# Patient Record
Sex: Male | Born: 1985 | Race: White | Hispanic: No | Marital: Single | Smoking: Never smoker
Health system: Southern US, Community
[De-identification: ages and names within clinical notes are randomized; demographics above are authoritative.]

## PROBLEM LIST (undated history)

## (undated) DIAGNOSIS — D649 Anemia, unspecified: Secondary | ICD-10-CM

## (undated) DIAGNOSIS — C801 Malignant (primary) neoplasm, unspecified: Secondary | ICD-10-CM

## (undated) DIAGNOSIS — T7840XA Allergy, unspecified, initial encounter: Secondary | ICD-10-CM

## (undated) HISTORY — DX: Malignant (primary) neoplasm, unspecified: C80.1

## (undated) HISTORY — DX: Anemia, unspecified: D64.9

## (undated) HISTORY — PX: OTHER SURGICAL HISTORY: SHX169

## (undated) HISTORY — DX: Allergy, unspecified, initial encounter: T78.40XA

---

## 2003-11-01 ENCOUNTER — Encounter: Admission: RE | Admit: 2003-11-01 | Discharge: 2003-11-01 | Payer: Self-pay

## 2003-12-13 ENCOUNTER — Encounter: Admission: RE | Admit: 2003-12-13 | Discharge: 2003-12-13 | Payer: Self-pay

## 2005-10-15 ENCOUNTER — Encounter: Admission: RE | Admit: 2005-10-15 | Discharge: 2005-10-15 | Payer: Self-pay | Admitting: Orthopedic Surgery

## 2007-06-14 ENCOUNTER — Encounter: Admission: RE | Admit: 2007-06-14 | Discharge: 2007-06-14 | Payer: Self-pay | Admitting: Orthopedic Surgery

## 2009-01-03 ENCOUNTER — Encounter: Admission: RE | Admit: 2009-01-03 | Discharge: 2009-01-03 | Payer: Self-pay | Admitting: Family Medicine

## 2012-09-12 ENCOUNTER — Ambulatory Visit (INDEPENDENT_AMBULATORY_CARE_PROVIDER_SITE_OTHER): Payer: BC Managed Care – PPO | Admitting: Family Medicine

## 2012-09-12 VITALS — BP 118/75 | HR 92 | Temp 98.6°F | Resp 17 | Ht 72.0 in | Wt 205.0 lb

## 2012-09-12 DIAGNOSIS — Z23 Encounter for immunization: Secondary | ICD-10-CM

## 2012-09-12 DIAGNOSIS — R300 Dysuria: Secondary | ICD-10-CM

## 2012-09-12 DIAGNOSIS — R309 Painful micturition, unspecified: Secondary | ICD-10-CM

## 2012-09-12 DIAGNOSIS — Z7189 Other specified counseling: Secondary | ICD-10-CM

## 2012-09-12 DIAGNOSIS — R369 Urethral discharge, unspecified: Secondary | ICD-10-CM

## 2012-09-12 DIAGNOSIS — Z113 Encounter for screening for infections with a predominantly sexual mode of transmission: Secondary | ICD-10-CM

## 2012-09-12 DIAGNOSIS — Z789 Other specified health status: Secondary | ICD-10-CM

## 2012-09-12 LAB — POCT UA - MICROSCOPIC ONLY
Bacteria, U Microscopic: NEGATIVE
Casts, Ur, LPF, POC: NEGATIVE
Crystals, Ur, HPF, POC: NEGATIVE
Epithelial cells, urine per micros: NEGATIVE
Mucus, UA: NEGATIVE
Yeast, UA: NEGATIVE

## 2012-09-12 LAB — POCT URINALYSIS DIPSTICK
Blood, UA: NEGATIVE
Glucose, UA: NEGATIVE
Nitrite, UA: NEGATIVE
Spec Grav, UA: 1.015
Urobilinogen, UA: 0.2
pH, UA: 6.5

## 2012-09-12 MED ORDER — CEFTRIAXONE SODIUM 1 G IJ SOLR
250.0000 mg | INTRAMUSCULAR | Status: DC
  Administered 2012-09-12: 250 mg via INTRAMUSCULAR

## 2012-09-12 MED ORDER — AZITHROMYCIN 250 MG PO TABS: ORAL_TABLET | ORAL | Status: DC

## 2012-09-12 NOTE — Progress Notes (Signed)
[facility] [address], [place] [address] [phone]  Date:  09/12/2012   Name:  Matthew Santiago   DOB:  04-18-1986   MRN:  [id number]  PCP:  No primary provider on file.    Chief Complaint: Other and penis discharge   History of Present Illness:  Matthew Santiago is a 26 y.o. very pleasant male patient who presents with the following:  He noted some pain with urination and penile discharge- discharge appears clear- since last night.  Otherwise he feels ok except he is tired.  No back pain or fever.    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  No further treatment needed- he is now 10 years cancer- free and considered cured.   He did have a new sexual partner not long ago.  He is concerned about STIs and would like screning  No abdominal pain, no nausea or vomiting  It appears that there was some confusion in 2011- he requested STI screening and started Gardasil at a visit in November.  His Hep B titer showed that he was immune through vaccination.  However, when Bas[place] heard these results he was told that he "did not need the rest of the shots" and thought he was immune to HPV.  Therefore, he never completed the series.   There is no problem list on file for this patient.   Past Medical History  Diagnosis Date  . Cancer     History reviewed. No pertinent past surgical history.  History  Substance Use Topics  . Smoking status: Never Smoker   . Smokeless tobacco: Not on file  . Alcohol Use: 1.2 oz/week    2 Cans of beer per week    History reviewed. No pertinent family history.  No Known Allergies  Medication list has been reviewed and updated.  No current outpatient prescriptions on file prior to visit.    Review of Systems:  As per HPI- otherwise negative.   Physical Examination: Filed Vitals:   09/12/12 1400  BP: 118/75  Pulse: 92  Temp: 98.6 F (37 C)  Resp: 17   Filed Vitals:    09/12/12 1400  Height: 6' (1.829 m)  Weight: 205 lb (92.987 kg)   Body mass index is 27.80 kg/(m^2). Ideal Body Weight: Weight in (lb) to have BMI = 25: 183.9   GEN: WDWN, NAD, Non-toxic, A & O x 3 HEENT: Atraumatic, Normocephalic. Neck supple. No masses, No LAD. Ears and Nose: No external deformity. CV: RRR, No M/G/R. No JVD. No thrill. No extra heart sounds. PULM: CTA B, no wheezes, crackles, rhonchi. No retractions. No resp. distress. No accessory muscle use. ABD: S, NT, ND EXTR: No c/c/e NEURO Normal gait.  PSYCH: Normally interactive. Conversant. Not depressed or anxious appearing.  Calm demeanor.  Gu; no scrotal masses, no apparent penile discharge  Results for orders placed in visit on 09/12/12  POCT UA - MICROSCOPIC ONLY      Component Value Range   WBC, Ur, HPF, POC neg     RBC, urine, microscopic neg\     Bacteria, U Microscopic neg     Mucus, UA neg     Epithelial cells, urine per micros neg     Crystals, Ur, HPF, POC neg     Casts, Ur, LPF, POC neg     Yeast, UA neg    POCT URINALYSIS DIPSTICK      Component  Value Range   Color, UA yellow     Clarity, UA clear     Glucose, UA neg     Bilirubin, UA neg     Ketones, UA neg     Spec Grav, UA 1.015     Blood, UA neg     pH, UA 6.5     Protein, UA neg     Urobilinogen, UA 0.2     Nitrite, UA neg     Leukocytes, UA Negative      Assessment and Plan: 1. Urination pain  POCT UA - Microscopic Only, POCT urinalysis dipstick, Urine culture  2. Penile discharge  GC/chlamydia probe amp, urine, cefTRIAXone (ROCEPHIN) injection 250 mg, azithromycin (ZITHROMAX) 250 MG tablet  3. Routine screening for STI (sexually transmitted infection)  HIV antibody, Hepatitis C antibody, RPR, HSV(herpes simplex vrs) 1+2 ab-IgG   Possible STI exposure.  Kassidy is going out of town for Christmas and is concerned- he would like to go ahead and be treated today in case he does have gonorrhea or chlamydia.  Gave shot of rocephin and rx  for 1gm of azithromycin to hold.  Also did BW as above.  Explained that he does not need Hep B testing any more as he is immune.    There was a mix- up in 2011- he received his 1st gardasil shot.  He was tested for Hep B and was immune- he was then told that he did not need to continue shots.  He did not realize we were talking about Hep B and he was talking about Gardasil.  This appears to be our mistake.  Apologized for this confusion.  He would like to go ahead and do Gardasil #2 today.  He called his insurance company and they will still cover the shots.  He is aware that this is off- label use as he is over the age of 72, but he feels comfortable going ahead.  He will RTC in 4 months for his last gardasil.   Meds ordered this encounter  Medications  . cefTRIAXone (ROCEPHIN) injection 250 mg    Sig:   . azithromycin (ZITHROMAX) 250 MG tablet    Sig: Take 4 tablets once    Dispense:  4 tablet    Refill:  0     Authur Cubit, MD

## 2012-09-12 NOTE — Patient Instructions (Addendum)
I will be in touch with you regarding your labs- if need be you can fill and take the azithromycin.   I am sorry for the mix- up regarding your Gardasil shots.  Your next Gardasil will be due in 4 months.  You are immune to hepatitis B through vaccination and do not have to be tested for this again

## 2012-09-13 LAB — HEPATITIS C ANTIBODY: HCV Ab: NEGATIVE

## 2012-09-13 LAB — HIV ANTIBODY (ROUTINE TESTING W REFLEX): HIV: NONREACTIVE

## 2012-09-13 LAB — HSV(HERPES SIMPLEX VRS) I + II AB-IGG: HSV 2 Glycoprotein G Ab, IgG: 0.17 IV

## 2012-09-13 LAB — RPR

## 2012-09-13 LAB — GC/CHLAMYDIA PROBE AMP, URINE: GC Probe Amp, Urine: NEGATIVE

## 2012-09-14 ENCOUNTER — Encounter: Payer: Self-pay | Admitting: Family Medicine

## 2013-06-01 ENCOUNTER — Ambulatory Visit (INDEPENDENT_AMBULATORY_CARE_PROVIDER_SITE_OTHER): Payer: BC Managed Care – PPO | Admitting: Family Medicine

## 2013-06-01 VITALS — BP 102/60 | HR 96 | Temp 98.6°F | Resp 19 | Wt 192.0 lb

## 2013-06-01 DIAGNOSIS — J029 Acute pharyngitis, unspecified: Secondary | ICD-10-CM

## 2013-06-01 DIAGNOSIS — R509 Fever, unspecified: Secondary | ICD-10-CM

## 2013-06-01 DIAGNOSIS — R52 Pain, unspecified: Secondary | ICD-10-CM

## 2013-06-01 LAB — POCT RAPID STREP A (OFFICE): Rapid Strep A Screen: NEGATIVE

## 2013-06-01 LAB — POCT CBC
Hemoglobin: 15.6 g/dL (ref 14.1–18.1)
Lymph, poc: 0.9 (ref 0.6–3.4)
MCH, POC: 32 pg — AB (ref 27–31.2)
MCHC: 32.6 g/dL (ref 31.8–35.4)
MID (cbc): 0.5 (ref 0–0.9)
MPV: 8 fL (ref 0–99.8)
POC Granulocyte: 5.5 (ref 2–6.9)
POC MID %: 7.4 %M (ref 0–12)
Platelet Count, POC: 160 10*3/uL (ref 142–424)
RBC: 4.88 M/uL (ref 4.69–6.13)
WBC: 6.9 10*3/uL (ref 4.6–10.2)

## 2013-06-01 MED ORDER — DOXYCYCLINE HYCLATE 100 MG PO TABS: 100.0000 mg | ORAL_TABLET | Freq: Two times a day (BID) | ORAL | Status: DC

## 2013-06-01 NOTE — Progress Notes (Addendum)
[facility] [address], [place] [address] [phone]  Date:  06/01/2013   Name:  Matthew Santiago   DOB:  1986/07/08   MRN:  [id number]  PCP:  No primary provider on file.    Chief Complaint: fatigue, soreness, coughing, congestion, headaches, dry mout   History of Present Illness:  ADIS STURGILL is a 27 y.o. very pleasant male patient who presents with the following:  He is here today with illness. He noted that he felt warm on Wednesday night.  he has noted fatigue, ST, cough, aches, chills.  He has felt thirsty.    He has general aches, but no pains in particular.   No urinary sx.   No nausea, vomiting or diarrhea.  He has been eating.    He is generally healthy, no sick contacts.   He has been taking naproxen- he took a cold and flu medication last night.   This am he took dayquil.  He took this around 0830 this am.   He does spend a lot of time outdoors  History of bone cancer and reconstructive chest wallsurgery at age 73-now in remission.    Patient Active Problem List   Diagnosis Date Noted  . Immune to hepatitis B 09/12/2012    Past Medical History  Diagnosis Date  . Cancer     History reviewed. No pertinent past surgical history.  History  Substance Use Topics  . Smoking status: Never Smoker   . Smokeless tobacco: Not on file  . Alcohol Use: 1.2 oz/week    2 Cans of beer per week    Family History  Problem Relation Age of Onset  . Cancer Maternal Grandmother   . Heart disease Maternal Grandfather   . Cancer Paternal Grandmother     No Known Allergies  Medication list has been reviewed and updated.  Current Outpatient Prescriptions on File Prior to Visit  Medication Sig Dispense Refill  . azithromycin (ZITHROMAX) 250 MG tablet Take 4 tablets once  4 tablet  0   No current facility-administered medications on file prior to visit.    Review of Systems:  As per HPI- otherwise negative.   Physical  Examination: Filed Vitals:   06/01/13 1253  BP: 98/62  Pulse: 106  Temp: 102.3 F (39.1 C)  Resp: 19   Filed Vitals:   06/01/13 1253  Weight: 192 lb (87.091 kg)   Body mass index is 26.03 kg/(m^2). Ideal Body Weight:    GEN: WDWN, NAD, Non-toxic, A & O x 3 HEENT: Atraumatic, Normocephalic. Neck supple. No masses, No LAD.  Bilateral TM wnl, oropharynx injected with exudate bilaterally, no sign of abscess.  PEERL,EOMI.   Ears and Nose: No external deformity. CV: RRR, No M/G/R. No JVD. No thrill. No extra heart sounds. PULM: CTA B, no wheezes, crackles, rhonchi. No retractions. No resp. distress. No accessory muscle use. ABD: S, NT, ND, +BS. No rebound. No HSM. EXTR: No c/c/e, no rash NEURO Normal gait.  PSYCH: Normally interactive. Conversant. Not depressed or anxious appearing.  Calm demeanor.    Results for orders placed in visit on 06/01/13  POCT RAPID STREP A (OFFICE)      Result Value Range   Rapid Strep A Screen Negative  Negative  POCT INFLUENZA A/B      Result Value Range   Influenza A, POC Negative     Influenza B, POC Negative    POCT CBC      Result  Value Range   WBC 6.9  4.6 - 10.2 K/uL   Lymph, poc 0.9  0.6 - 3.4   POC LYMPH PERCENT 13.4  10 - 50 %L   MID (cbc) 0.5  0 - 0.9   POC MID % 7.4  0 - 12 %M   POC Granulocyte 5.5  2 - 6.9   Granulocyte percent 79.2  37 - 80 %G   RBC 4.88  4.69 - 6.13 M/uL   Hemoglobin 15.6  14.1 - 18.1 g/dL   HCT, POC 74.2  59.5 - 53.7 %   MCV 97.9 (*) 80 - 97 fL   MCH, POC 32.0 (*) 27 - 31.2 pg   MCHC 32.6  31.8 - 35.4 g/dL   RDW, POC 63.8     Platelet Count, POC 160  142 - 424 K/uL   MPV 8.0  0 - 99.8 fL   Placed an 18 gauge IV in right AC per Elease Etienne, LPN Given 756 mg of ibuprofen po- fever resolved as per VS.   Given a liter of IV saline; felt better, "I feel hungry"   Assessment and Plan: Sore throat - Plan: POCT rapid strep A, POCT CBC, Culture, Group A Strep  Body aches - Plan: POCT Influenza A/B  Fever,  unspecified - Plan: POCT CBC, Culture, Group A Strep, doxycycline (VIBRA-TABS) 100 MG tablet, HIV Antibody  Fever, sore throat, body aches.   Suspect a viral infection but will cover for RMSF given high fever without definite diagnosis.  He will rest and push fluids.   If getting worse or not better please call or RTC  Signed Abbe Amsterdam, MD  Called to check on him.  He is feeling better, temperature this am was 98.  He still is tired and having some sweats, but overall he is improved.  Let him know HIV is negative.

## 2013-06-01 NOTE — Patient Instructions (Addendum)
Use the doxycycline as directed.  Rest, and drink plenty of fluids.  Eat a bland diet as tolerated.  If you are not feeling better over the next couple of days please let me know.  If you start to get worse please let us know right away.    Alternate tylenol and ibuprofen as needed for fever control.

## 2013-06-02 LAB — HIV ANTIBODY (ROUTINE TESTING W REFLEX): HIV: NONREACTIVE

## 2013-06-03 ENCOUNTER — Encounter: Payer: Self-pay | Admitting: Family Medicine

## 2013-06-03 LAB — CULTURE, GROUP A STREP: Organism ID, Bacteria: NORMAL

## 2013-08-30 ENCOUNTER — Ambulatory Visit (INDEPENDENT_AMBULATORY_CARE_PROVIDER_SITE_OTHER): Payer: BC Managed Care – PPO | Admitting: Family Medicine

## 2013-08-30 ENCOUNTER — Telehealth: Payer: Self-pay

## 2013-08-30 VITALS — BP 122/72 | HR 74 | Temp 98.0°F | Resp 16 | Ht 72.0 in | Wt 190.0 lb

## 2013-08-30 DIAGNOSIS — F988 Other specified behavioral and emotional disorders with onset usually occurring in childhood and adolescence: Secondary | ICD-10-CM

## 2013-08-30 MED ORDER — METHYLPHENIDATE HCL ER (OSM) 18 MG PO TBCR: 18.0000 mg | EXTENDED_RELEASE_TABLET | Freq: Every day | ORAL | Status: AC

## 2013-08-30 NOTE — Progress Notes (Signed)
Patient ID: Matthew Santiago MRN: [id number], DOB: 1986/07/25, 27 y.o. Date of Encounter: 08/30/2013, 4:00 PM  Primary Physician: No primary provider on file.  Chief Complaint: ADD/ADHD evaluation  HPI: 27 y.o. male with history below presents for ADD/ADHD evaluation. Patient would like treatment for his ADHD while taking his Cysco course/test this month and January that costs $3,500. Patient was previously on Concerta for a brief time in 2008 during finals and did well with this. He would like to go back on something similar to this. He does not want to be on anything long term. He just does not want to have to retake this course/test. He states that he has a difficult time staying on task once he sits down to start a task. He will eventually get the job done, but it may take a while and he worries about that with this upcoming course. When he was on Concerta previously he tolerated it well. He states that he was "muted" however. No chest pain or palpitations. No difficulty sleeping.   Of note, the patient had a benign chest wall tumor excised in 2003 and developed some palpitations for a brief time afterwards. He has not had any palpitations since 2005 or 2006. He has been cleared by cardiology for full activity.     Past Medical History  Diagnosis Date  . Cancer      Home Meds: Prior to Admission medications   Not on File    Allergies: No Known Allergies  History   Social History  . Marital Status: Single    Spouse Name: N/A    Number of Children: N/A  . Years of Education: N/A   Occupational History  . Not on file.   Social History Main Topics  . Smoking status: Never Smoker   . Smokeless tobacco: Not on file  . Alcohol Use: 1.2 oz/week    2 Cans of beer per week  . Drug Use: No  . Sexual Activity: Yes    Birth Control/ Protection: None   Other Topics Concern  . Not on file   Social History Narrative  . No narrative on file     Review of  Systems: Constitutional: negative for chills, fever, or fatigue  HEENT: negative for vision changes or hearing loss Cardiovascular: negative for chest pain or palpitations Respiratory: negative for hemoptysis, wheezing, shortness of breath, or cough Abdominal: negative for abdominal pain, nausea, vomiting, or diarrhea Dermatological: negative for rash Neurologic: negative for headache, dizziness, or syncope   Physical Exam: Blood pressure 122/72, pulse 74, temperature 98 F (36.7 C), temperature source Oral, resp. rate 16, height 6' (1.829 m), weight 190 lb (86.183 kg), SpO2 100.00%., Body mass index is 25.76 kg/(m^2). General: Well developed, well nourished, in no acute distress. Head: Normocephalic, atraumatic, eyes without discharge, sclera non-icteric, nares are without discharge. Bilateral auditory canals clear, TM's are without perforation, pearly grey and translucent with reflective cone of light bilaterally. Oral cavity moist, posterior pharynx without exudate, erythema, peritonsillar abscess, or post nasal drip.  Neck: Supple. No thyromegaly. Full ROM. No lymphadenopathy. Lungs: Clear bilaterally to auscultation without wheezes, rales, or rhonchi. Breathing is unlabored. Heart: RRR with S1 S2. No murmurs, rubs, or gallops appreciated. Msk:  Strength and tone normal for age. Extremities/Skin: Warm and dry. No clubbing or cyanosis. No edema. No rashes or suspicious lesions. Neuro: Alert and oriented X 3. Moves all extremities spontaneously. Gait is normal. CNII-XII grossly in tact. Psych:  Responds to questions appropriately with  a normal affect.   EKG: Horizontal axis question lead placement, T wave inversion in III and AVF, no prior to compare to   ASSESSMENT AND PLAN:  27 y.o. male here for ADD/ADHD evaluation -Records reviewed from ADD/ADHD evaluation 06/04/2001, documents ADHD, see scanned in copy -Patient would only like to take medication for the one month while he is  preparing for and taking his Cysco course/test that costs $3,500 -Database reviewed -Concerta 18 mg 1 po daily #30 no RF -Patient to get prior EKG and contact us. We will set up referral at that time. He agrees to do this.   Signed, Eula Listen, PA-C [facility] [place], [place] [address] [phone] 08/30/2013 4:00 PM

## 2013-08-30 NOTE — Telephone Encounter (Signed)
Pt was seen today by ryan dunn, pt states medication prescribed will need prior authorization

## 2013-08-30 NOTE — Telephone Encounter (Signed)
Pt was seen by ryan dunn today and states the medication prescribed requires prior authorization

## 2013-08-31 NOTE — Telephone Encounter (Signed)
Prior auth obtained: Matthew Santiago Jul 01, 2013 to Jul 01, 2014

## 2013-09-01 NOTE — Progress Notes (Signed)
EKG read and patient discussed with Ryan Dunn, PA-C. Agree with assessment and plan of care per his note.   

## 2013-09-03 ENCOUNTER — Other Ambulatory Visit: Payer: Self-pay | Admitting: Radiology

## 2013-09-05 NOTE — Progress Notes (Signed)
PA approved for concerta through 08/31/14. Faxed pharm.

## 2014-11-20 ENCOUNTER — Ambulatory Visit (INDEPENDENT_AMBULATORY_CARE_PROVIDER_SITE_OTHER): Payer: BLUE CROSS/BLUE SHIELD | Admitting: Family Medicine

## 2014-11-20 VITALS — BP 106/64 | HR 84 | Temp 98.2°F | Resp 16 | Ht 72.0 in | Wt 202.0 lb

## 2014-11-20 DIAGNOSIS — Z7189 Other specified counseling: Secondary | ICD-10-CM

## 2014-11-20 DIAGNOSIS — Z23 Encounter for immunization: Secondary | ICD-10-CM

## 2014-11-20 DIAGNOSIS — Z2839 Other underimmunization status: Secondary | ICD-10-CM

## 2014-11-20 DIAGNOSIS — Z7184 Encounter for health counseling related to travel: Secondary | ICD-10-CM

## 2014-11-20 DIAGNOSIS — Z283 Underimmunization status: Secondary | ICD-10-CM

## 2014-11-20 MED ORDER — ATOVAQUONE-PROGUANIL HCL 250-100 MG PO TABS: ORAL_TABLET | ORAL | Status: DC

## 2014-11-20 MED ORDER — CIPROFLOXACIN HCL 500 MG PO TABS: 500.0000 mg | ORAL_TABLET | Freq: Two times a day (BID) | ORAL | Status: DC

## 2014-11-20 NOTE — Patient Instructions (Addendum)
Check to see if he needs a second hep A  Check to see if he needs a polio booster  Take Malarone (atovaquone/proguanil) one daily from 2 days before possible exposure to one week after possible exposure  Clean water precaution always  Read the travelers section country specific at [facility].gov

## 2014-11-20 NOTE — Progress Notes (Signed)
29 year old man who is getting ready to go to the [place] and [place] on a mission trip. 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 He'll be gone for the [place] for a little over 2 weeks. We reviewed the requirements. He needs another hepatitis A vaccine, Larry prophylaxis, traveler's diarrhea prophylaxis, and should consider getting a polio booster for [place].  Objective: Healthy otherwise with no major complaints. She wanted to discuss weight loss a little bit which we did  . Assessment: International travel advice Immunization review and update  Plan: Hep A vaccine. He should talk to the health department about possibly getting a polio vaccine though the risk is low. Gave her prescription for Cipro.

## 2015-08-31 ENCOUNTER — Ambulatory Visit (INDEPENDENT_AMBULATORY_CARE_PROVIDER_SITE_OTHER): Payer: BLUE CROSS/BLUE SHIELD | Admitting: Family Medicine

## 2015-08-31 VITALS — BP 112/68 | HR 81 | Temp 97.9°F | Resp 18 | Ht 72.0 in | Wt 203.0 lb

## 2015-08-31 DIAGNOSIS — Z202 Contact with and (suspected) exposure to infections with a predominantly sexual mode of transmission: Secondary | ICD-10-CM

## 2015-08-31 DIAGNOSIS — J029 Acute pharyngitis, unspecified: Secondary | ICD-10-CM

## 2015-08-31 DIAGNOSIS — J40 Bronchitis, not specified as acute or chronic: Secondary | ICD-10-CM | POA: Diagnosis not present

## 2015-08-31 MED ORDER — AMOXICILLIN-POT CLAVULANATE 875-125 MG PO TABS: 1.0000 | ORAL_TABLET | Freq: Two times a day (BID) | ORAL | Status: DC

## 2015-08-31 NOTE — Progress Notes (Signed)
@UMFCLOGO @  By signing my name below, I, Raven Small, attest that this documentation has been prepared under the direction and in the presence of Robyn Haber, MD.  Electronically Signed: Thea Alken, ED Scribe. 08/31/2015. 11:11 AM.  Patient ID: Matthew Santiago MRN: [id number], DOB: 1986/02/24, 29 y.o. Date of Encounter: 08/31/2015, 11:11 AM  Primary Physician: No primary care provider on file.  Chief Complaint:  Chief Complaint  Patient presents with  . URI    x1 week now   . std check    no sxs just checking    HPI: 29 y.o. year old male with history below presents with a cough Pt has had scratchy throat, productive cough and post nasal drip for 1 week. Pt has hx of cancer and had an implant in chest to replace surgical expirtation.   Pt would also like to be checked for STD's. He is asymptomatic.   Pt works with Nutritional therapist.    Past Medical History  Diagnosis Date  . Cancer ([place])   . Allergy   . Anemia      Home Meds: Prior to Admission medications   Medication Sig Start Date End Date Taking? Authorizing Provider  atovaquone-proguanil (MALARONE) 250-100 MG TABS Take one daily beginning 2 days before entry to country and continue one week after leaving countries Patient not taking: Reported on 08/31/2015 11/20/14   Posey Boyer, MD  ciprofloxacin (CIPRO) 500 MG tablet Take 1 tablet (500 mg total) by mouth 2 (two) times daily. Patient not taking: Reported on 08/31/2015 11/20/14   Posey Boyer, MD  methylphenidate (CONCERTA) 18 MG CR tablet Take 1 tablet (18 mg total) by mouth daily. Patient not taking: Reported on 08/31/2015 08/30/13   Rise Mu, PA-C    Allergies: No Known Allergies  Social History   Social History  . Marital Status: Single    Spouse Name: N/A  . Number of Children: N/A  . Years of Education: N/A   Occupational History  . Not on file.   Social History Main Topics  . Smoking status: Never Smoker   . Smokeless tobacco: Not on file   . Alcohol Use: 1.2 oz/week    2 Cans of beer per week  . Drug Use: No  . Sexual Activity: Yes    Birth Control/ Protection: None   Other Topics Concern  . Not on file   Social History Narrative     Review of Systems: Constitutional: negative for chills, fever, night sweats, weight changes, or fatigue  HEENT: negative for vision changes, hearing loss, congestion, rhinorrhea, ST, epistaxis, or sinus pressure Cardiovascular: negative for chest pain or palpitations Respiratory: negative for hemoptysis, wheezing, shortness of breath. Positive for cough Abdominal: negative for abdominal pain, nausea, vomiting, diarrhea, or constipation Dermatological: negative for rash Neurologic: negative for headache, dizziness, or syncope All other systems reviewed and are otherwise negative with the exception to those above and in the HPI.   Physical Exam: Blood pressure 112/68, pulse 81, temperature 97.9 F (36.6 C), temperature source Oral, resp. rate 18, height 6' (1.829 m), weight 203 lb (92.08 kg), SpO2 98 %., Body mass index is 27.53 kg/(m^2). General: Well developed, well nourished, in no acute distress. Head: Normocephalic, atraumatic, eyes without discharge, sclera non-icteric, nares are without discharge. Bilateral auditory canals clear, TM's are without perforation, pearly grey and translucent with reflective cone of light bilaterally. Oral cavity moist, posterior pharynx with erythema but no exudate, peritonsillar abscess, or post nasal drip.  Neck:  Supple. No thyromegaly. Full ROM. No lymphadenopathy. Lungs:  Rhonchi in chest. Breathing is unlabored. Heart: RRR with S1 S2. No murmurs, rubs, or gallops appreciated. Abdomen: Soft, non-tender, non-distended with normoactive bowel sounds. No hepatomegaly. No rebound/guarding. No obvious abdominal masses. Msk:  Strength and tone normal for age. Extremities/Skin: Warm and dry. No clubbing or cyanosis. No edema. No rashes or suspicious lesions.  Well healed large scars on chest and upper abdomen.  Neuro: Alert and oriented X 3. Moves all extremities spontaneously. Gait is normal. CNII-XII grossly in tact. Psych:  Responds to questions appropriately with a normal affect.    ASSESSMENT AND PLAN:  29 y.o. year old male with URI and STD exposrue    ICD-9-CM ICD-10-CM   1. Bronchitis 490 J40 amoxicillin-clavulanate (AUGMENTIN) 875-125 MG tablet     POCT CBC  2. Acute pharyngitis, unspecified etiology 462 J02.9 amoxicillin-clavulanate (AUGMENTIN) 875-125 MG tablet     GC probe amplification, urine     HIV antibody     RPR  3. Exposure to STD V01.6 Z20.2    This chart was scribed in my presence and reviewed by me personally.   Signed, Robyn Haber, MD 08/31/2015 11:11 AM

## 2015-09-01 LAB — HIV ANTIBODY (ROUTINE TESTING W REFLEX): HIV 1&2 Ab, 4th Generation: NONREACTIVE

## 2015-09-01 LAB — RPR

## 2015-09-02 LAB — GC PROBE AMPLIFICATION, URINE: GC Probe Amp, Urine: NOT DETECTED

## 2016-04-21 ENCOUNTER — Ambulatory Visit (INDEPENDENT_AMBULATORY_CARE_PROVIDER_SITE_OTHER): Admitting: Physician Assistant

## 2016-04-21 VITALS — BP 116/74 | HR 83 | Temp 98.3°F | Resp 16 | Ht 73.0 in | Wt 198.4 lb

## 2016-04-21 DIAGNOSIS — S0181XA Laceration without foreign body of other part of head, initial encounter: Secondary | ICD-10-CM

## 2016-04-21 NOTE — Patient Instructions (Signed)
Please return in 5-7 days for suture removal.  Facial Laceration  A facial laceration is a cut on the face. These injuries can be painful and cause bleeding. Lacerations usually heal quickly, but they need special care to reduce scarring. DIAGNOSIS  Your health care provider will take a medical history, ask for details about how the injury occurred, and examine the wound to determine how deep the cut is. TREATMENT  Some facial lacerations may not require closure. Others may not be able to be closed because of an increased risk of infection. The risk of infection and the chance for successful closure will depend on various factors, including the amount of time since the injury occurred. The wound may be cleaned to help prevent infection. If closure is appropriate, pain medicines may be given if needed. Your health care provider will use stitches (sutures), wound glue (adhesive), or skin adhesive strips to repair the laceration. These tools bring the skin edges together to allow for faster healing and a better cosmetic outcome. If needed, you may also be given a tetanus shot. HOME CARE INSTRUCTIONS  Only take over-the-counter or prescription medicines as directed by your health care provider.  Follow your health care provider's instructions for wound care. These instructions will vary depending on the technique used for closing the wound. For Sutures:  Keep the wound clean and dry.   If you were given a bandage (dressing), you should change it at least once a day. Also change the dressing if it becomes wet or dirty, or as directed by your health care provider.   Wash the wound with soap and water 2 times a day. Rinse the wound off with water to remove all soap. Pat the wound dry with a clean towel.   After cleaning, apply a thin layer of the antibiotic ointment recommended by your health care provider. This will help prevent infection and keep the dressing from sticking.   You may shower as  usual after the first 24 hours. Do not soak the wound in water until the sutures are removed.   Get your sutures removed as directed by your health care provider. With facial lacerations, sutures should usually be taken out after 4-5 days to avoid stitch marks.   Wait a few days after your sutures are removed before applying any makeup. For Skin Adhesive Strips:  Keep the wound clean and dry.   Do not get the skin adhesive strips wet. You may bathe carefully, using caution to keep the wound dry.   If the wound gets wet, pat it dry with a clean towel.   Skin adhesive strips will fall off on their own. You may trim the strips as the wound heals. Do not remove skin adhesive strips that are still stuck to the wound. They will fall off in time.  For Wound Adhesive:  You may briefly wet your wound in the shower or bath. Do not soak or scrub the wound. Do not swim. Avoid periods of heavy sweating until the skin adhesive has fallen off on its own. After showering or bathing, gently pat the wound dry with a clean towel.   Do not apply liquid medicine, cream medicine, ointment medicine, or makeup to your wound while the skin adhesive is in place. This may loosen the film before your wound is healed.   If a dressing is placed over the wound, be careful not to apply tape directly over the skin adhesive. This may cause the adhesive to be pulled  off before the wound is healed.   Avoid prolonged exposure to sunlight or tanning lamps while the skin adhesive is in place.  The skin adhesive will usually remain in place for 5-10 days, then naturally fall off the skin. Do not pick at the adhesive film.  After Healing: Once the wound has healed, cover the wound with sunscreen during the day for 1 full year. This can help minimize scarring. Exposure to ultraviolet light in the first year will darken the scar. It can take 1-2 years for the scar to lose its redness and to heal completely.  SEEK MEDICAL  CARE IF:  You have a fever. SEEK IMMEDIATE MEDICAL CARE IF:  You have redness, pain, or swelling around the wound.   You see ayellowish-white fluid (pus) coming from the wound.    This information is not intended to replace advice given to you by your health care provider. Make sure you discuss any questions you have with your health care provider.   Document Released: 10/21/2004 Document Revised: 10/04/2014 Document Reviewed: 04/26/2013 Elsevier Interactive Patient Education [employer].

## 2016-04-21 NOTE — Progress Notes (Signed)
[facility] [address], [place] [address] [phone]  Date:  04/21/2016   Name:  Matthew Santiago   DOB:  [birth year]   MRN:  [id number]  PCP:  No primary care provider on file.    History of Present Illness:  Matthew Santiago is a 29 y.o. male patient who presents to [facility] for cc of facial laceration. 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  No loc.  He rinsed it with water.  Mild dizziness occurred at the initial hit, but then dissipated.  He denies headache except for localized head pain to laceration.  No nausea or disorientation.    Patient Active Problem List   Diagnosis Date Noted  . Immune to hepatitis B 09/12/2012    Past Medical History:  Diagnosis Date  . Allergy   . Anemia   . Cancer [facility])     Past Surgical History:  Procedure Laterality Date  . 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 Smoking status: Never Smoker  . Smokeless tobacco: Not on file  . Alcohol use 1.2 oz/week    2 Cans of beer per week    Family History  Problem Relation Age of Onset  . Cancer Maternal Grandmother   . Diabetes Maternal Grandmother   . Heart disease Maternal Grandfather   . Cancer Paternal Grandmother   . Hyperlipidemia Brother     No Known Allergies  Medication list has been reviewed and updated.  Current Outpatient Prescriptions on File Prior to Visit  Medication Sig Dispense Refill  . methylphenidate (CONCERTA) 18 MG CR tablet Take 1 tablet (18 mg total) by mouth daily. 30 tablet 0   No current facility-administered medications on file prior to visit.     ROS ROS otherwise unremarkable unless listed above.   Physical Examination: BP 116/74 (BP Location: Right Arm, Patient Position: Sitting, Cuff Size: Normal)   Pulse 83   Temp 98.3 F (36.8 C) (Oral)   Resp 16   Ht 6\' 1"  (1.854 m)   Wt 198 lb 6.4 oz (90  kg)   SpO2 98%   BMI 26.18 kg/m  Ideal Body Weight: Weight in (lb) to have BMI = 25: 189.1  Physical Exam  Constitutional: He is oriented to person, place, and time. He appears well-developed and well-nourished. No distress.  HENT:  Head: Normocephalic and atraumatic.  Eyes: Conjunctivae and EOM are normal. Pupils are equal, round, and reactive to light.  Cardiovascular: Normal rate.   Pulmonary/Chest: Effort normal. No respiratory distress.  Neurological: He is alert and oriented to person, place, and time.  Skin: Skin is warm and dry. He is not diaphoretic.  1 cm horizontal laceration at the forehead. Tiny black particles along the lateral corners of the wound site. No erythema or purulent mucus.  Psychiatric: He has a normal mood and affect. His behavior is normal.   Procedure: Verbal consent obtained. Localize 1% lidocaine placed at the 1 cm wound site across his forehead. Wound cleansed with soap and water several times.  Forceps used to remove debris and wound was cleansed a second time with soap and water. 6-0 Ethilon utilized to apply for simple interrupted sutures.  Cleansed with normal saline.  Assessment and Plan: Matthew Santiago is a 30 y.o. male who is here today for scalp laceration.  Wound care discussed. Alarming symptoms to warrant immediate return discussed. Letter to her employer given. Return to clinic in 5-7 days for suture removal. Tetanus up-to-date. Laceration of forehead, initial encounter  Ivar Drape, PA-C [facility] 04/21/2016 3:44 PM

## 2016-04-26 ENCOUNTER — Ambulatory Visit (INDEPENDENT_AMBULATORY_CARE_PROVIDER_SITE_OTHER): Admitting: Physician Assistant

## 2016-04-26 VITALS — BP 112/78 | HR 73 | Temp 98.1°F | Resp 17 | Ht 73.0 in | Wt 197.0 lb

## 2016-04-26 DIAGNOSIS — Z4802 Encounter for removal of sutures: Secondary | ICD-10-CM

## 2016-04-26 DIAGNOSIS — S0181XA Laceration without foreign body of other part of head, initial encounter: Secondary | ICD-10-CM

## 2016-04-26 NOTE — Progress Notes (Signed)
Matthew Santiago 1986-07-14 30 y.o.   Chief Complaint  Patient presents with  . Suture / Staple Removal     Date of Injury: 7.26.2017  History of Present Illness:  Presents for evaluation of work-related complaint.  Patient is here for follow-up status post laceration repair of a laceration that occurred 5 days ago. Patient has kept the wound clean. He denies any redness or purulent drainage to the area. No increased pain.  He does not report any dizziness.  ROS ROS unremarkable unless listed above.    Current medications and allergies reviewed and updated. Past medical history, family history, social history have been reviewed and updated.   Physical Exam  Constitutional: He is well-developed, well-nourished, and in no distress. No distress.  Cardiovascular: Normal rate.   Pulmonary/Chest: Effort normal and breath sounds normal. No respiratory distress.  Neurological: He is alert.  Skin: Skin is warm and dry. He is not diaphoretic.  Sutures intact along both laceration of his forehead. There is no surrounding erythema or purulent fluid.     Assessment and Plan: 30 year old male is here today for suture removal of sutures status post laceration of forehead. Sutures removed.  He will return for follow up as needed. Steri-Strips placed on wound. Visit for suture removal  Ivar Drape, PA-C [facility] 8/17/20179:27 AM

## 2016-04-26 NOTE — Patient Instructions (Signed)
     IF you received an x-ray today, you will receive an invoice from [facility]. Please contact [facility] at [phone] with questions or concerns regarding your invoice.   IF you received labwork today, you will receive an invoice from [facility]. Please contact [facility] at [phone] with questions or concerns regarding your invoice.   Our billing staff will not be able to assist you with questions regarding bills from these companies.  You will be contacted with the lab results as soon as they are available. The fastest way to get your results is to activate your My Chart account. Instructions are located on the last page of this paperwork. If you have not heard from us regarding the results in 2 weeks, please contact this office.      

## 2016-07-13 ENCOUNTER — Ambulatory Visit (INDEPENDENT_AMBULATORY_CARE_PROVIDER_SITE_OTHER): Payer: Federal, State, Local not specified - PPO | Admitting: Family Medicine

## 2016-07-13 VITALS — BP 110/70 | HR 66 | Temp 97.8°F | Resp 16 | Ht 71.5 in | Wt 199.6 lb

## 2016-07-13 DIAGNOSIS — N342 Other urethritis: Secondary | ICD-10-CM | POA: Diagnosis not present

## 2016-07-13 DIAGNOSIS — L918 Other hypertrophic disorders of the skin: Secondary | ICD-10-CM | POA: Diagnosis not present

## 2016-07-13 DIAGNOSIS — Z23 Encounter for immunization: Secondary | ICD-10-CM

## 2016-07-13 DIAGNOSIS — R369 Urethral discharge, unspecified: Secondary | ICD-10-CM

## 2016-07-13 DIAGNOSIS — Z7251 High risk heterosexual behavior: Secondary | ICD-10-CM

## 2016-07-13 DIAGNOSIS — Z113 Encounter for screening for infections with a predominantly sexual mode of transmission: Secondary | ICD-10-CM

## 2016-07-13 MED ORDER — AZITHROMYCIN 250 MG PO TABS: 1000.0000 mg | ORAL_TABLET | Freq: Once | ORAL | 0 refills | Status: AC

## 2016-07-13 MED ORDER — CEFTRIAXONE SODIUM 250 MG IJ SOLR
250.0000 mg | Freq: Once | INTRAMUSCULAR | Status: AC
  Administered 2016-07-13: 250 mg via INTRAMUSCULAR

## 2016-07-13 NOTE — Progress Notes (Signed)
By signing my name below, I, Matthew Santiago, attest that this documentation has been prepared under the direction and in the presence of Matthew Ray, MD.  Electronically Signed: Verlee Santiago, Medical Scribe. 07/13/16. 4:20 PM.  Subjective:    Patient ID: Matthew Santiago, male    DOB: [birth year], 30 y.o.   MRN: [id number]  HPI Chief Complaint  Patient presents with  . Penile Discharge    x 1 day    HPI Comments: Matthew Santiago is a 30 y.o. male who [facility] complaining of discharge that could have been there last night but noted this morning. Pt has had dysuria with first urination this morning. Pt had unprotected intercourse with a new male partner last week. Pt has had genital bumps that's noticed after shaving with a straight razor and persisted after being negative tested for STDs. Pt has had ingrown hairs in the past. Pt has tested negative for syphilis, HIV, gonorrhea, and chlamydia every 6 months. He had 2 of his 3 HPV vaccines and had been told he had a positive titer for HepB. He is amenable to 3rd HPV vaccine today, depending. Pt denies testicular pain, penile pain, pain with defecation, fevers, chills, and back pain.  Patient Active Problem List   Diagnosis Date Noted  . Immune to hepatitis B 09/12/2012   Past Medical History:  Diagnosis Date  . Allergy   . Anemia   . Cancer [facility])    Past Surgical History:  Procedure Laterality Date  . 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 Authorizing Provider  methylphenidate (CONCERTA) 18 MG CR tablet Take 1 tablet (18 mg total) by mouth daily. 08/30/13  Yes Ryan M Dunn, PA-C  naproxen (NAPROSYN) 500 MG tablet Take 500 mg by mouth 2 (two) times daily with a meal.   Yes Historical Provider, MD   Social History   Social History  . Marital status: Single    Spouse name: N/A  . Number of  children: N/A  . Years of education: N/A   Occupational History  . Not on file.   Social History Main Topics  . Smoking status: Never Smoker  . Smokeless tobacco: Not on file  . Alcohol use 1.2 oz/week    2 Cans of beer per week  . Drug use: No  . Sexual activity: Yes    Birth control/ protection: None   Other Topics Concern  . Not on file   Social History Narrative  . No narrative on file   Review of Systems  Constitutional: Negative for chills and fever.  Genitourinary: Positive for discharge and dysuria. Negative for penile pain and testicular pain.  Musculoskeletal: Negative for back pain.   Objective:  Physical Exam  Constitutional: He appears well-developed and well-nourished. No distress.  HENT:  Head: Normocephalic and atraumatic.  Eyes: Conjunctivae are normal.  Neck: Neck supple.  Cardiovascular: Normal rate.   Pulmonary/Chest: Effort normal.  Genitourinary: Right testis shows no tenderness. Left testis shows no tenderness. No discharge found.  Genitourinary Comments: No appreciable penile discharge No testicular tenderness There were 2 small skin tags on dorsum of penis No other rash  Neurological: He is alert.  Skin: Skin is warm and dry.  Psychiatric: He has a normal mood and affect. His behavior is normal.  Nursing note and vitals reviewed.  BP 110/70 (BP Location: Right  Arm, Patient Position: Sitting, Cuff Size: Large)   Pulse 66   Temp 97.8 F (36.6 C) (Oral)   Resp 16   Ht 5' 11.5" (1.816 m)   Wt 199 lb 9.6 oz (90.5 kg)   SpO2 99%   BMI 27.45 kg/m    We discussed the potential cost for the Gardasil vaccine could be around $300 if not covered by insurance, and he still agreed to get it.   Assessment & Plan:   15 min review of previous records.  Matthew Santiago is a 30 y.o. male Infective urethritis - Plan: GC/Chlamydia Probe Amp, cefTRIAXone (ROCEPHIN) injection 250 mg, azithromycin (ZITHROMAX) 250 MG tablet Penile discharge - Plan:  GC/Chlamydia Probe Amp, HIV antibody, RPR Routine screening for STI (sexually transmitted infection) - Plan: GC/Chlamydia Probe Amp, HIV antibody, RPR History of unprotected sex - Plan: GC/Chlamydia Probe Amp  - Infective urethritis with history of unprotected intercourse. Discussed options of testing then treatment or initial treatment today while waiting on results. Chose to treat today, Rocephin 250 mg given to cover gonorrhea, azithromycin 1000 mg by mouth 1 for chlamydia. Check STI testing as above and safer sex practices discussed. Also recommend discussing testing with partner, but he would like to know results first  Need for HPV vaccination - Plan: HPV 9-valent vaccine,Recombinat  - Off label use discussed, and potential cost discussed, but he still would like to have his third of 3 vaccines. HPV 9 given.   Skin tag  - Few small skin tags on penile shaft. Does not appear to be genital warts, but advised if any change in size or new lesions, return for repeat evaluation. Current areas have not changed in some time.  Meds ordered this encounter  Medications  . cefTRIAXone (ROCEPHIN) injection 250 mg  . azithromycin (ZITHROMAX) 250 MG tablet    Sig: Take 4 tablets (1,000 mg total) by mouth once.    Dispense:  4 tablet    Refill:  0   Patient Instructions     Injection today treats gonorrhea, oral antibiotics will treat chlamydia. I will check for both of these infections on tests today as well as HIV and syphilis tests. If your symptoms persist, especially if the testing is negative, can look into other causes.  As we discussed, and the third HPV vaccine today is off label use based on your age, but you will now be complete with that series.  Return to the clinic or go to the nearest emergency room if any of your symptoms worsen or new symptoms occur.   Urethritis, Adult Urethritis is an inflammation of the tube through which urine exits your bladder (urethra).   CAUSES Urethritis is often caused by an infection in your urethra. The infection can be viral, like herpes. The infection can also be bacterial, like gonorrhea. RISK FACTORS Risk factors of urethritis include:  Having sex without using a condom.  Having multiple sexual partners.  Having poor hygiene. SIGNS AND SYMPTOMS Symptoms of urethritis are less noticeable in women than in men. These symptoms include:  Burning feeling when you urinate (dysuria).  Discharge from your urethra.  Blood in your urine (hematuria).  Urinating more than usual. DIAGNOSIS  To confirm a diagnosis of urethritis, your health care provider will do the following:  Ask about your sexual history.  Perform a physical exam.  Have you provide a sample of your urine for lab testing.  Use a cotton swab to gently collect a sample from your urethra  for lab testing. TREATMENT  It is important to treat urethritis. Depending on the cause, untreated urethritis may lead to serious genital infections and possibly infertility. Urethritis caused by a bacterial infection is treated with antibiotic medicine. All sexual partners must be treated.  HOME CARE INSTRUCTIONS  Do not have sex until the test results are known and treatment is completed, even if your symptoms go away before you finish treatment.  If you were prescribed an antibiotic, finish it all even if you start to feel better. SEEK MEDICAL CARE IF:   Your symptoms are not improved in 3 days.  Your symptoms are getting worse.  You develop abdominal pain or pelvic pain (in women).  You develop joint pain.  You have a fever. SEEK IMMEDIATE MEDICAL CARE IF:   You have severe pain in the belly, back, or side.  You have repeated vomiting. MAKE SURE YOU:  Understand these instructions.  Will watch your condition.  Will get help right away if you are not doing well or get worse.   This information is not intended to replace advice given to you by  your health care provider. Make sure you discuss any questions you have with your health care provider.   Document Released: 03/09/2001 Document Revised: 01/28/2015 Document Reviewed: 05/14/2013 Elsevier Interactive Patient Education 2016 [employer].   IF you received an x-Santiago today, you will receive an invoice from [facility]. Please contact [facility] at [phone] with questions or concerns regarding your invoice.   IF you received labwork today, you will receive an invoice from [employer]. Please contact [facility] at [phone] with questions or concerns regarding your invoice.   Our billing staff will not be able to assist you with questions regarding bills from these companies.  You will be contacted with the lab results as soon as they are available. The fastest way to get your results is to activate your My Chart account. Instructions are located on the last page of this paperwork. If you have not heard from [place] regarding the results in 2 weeks, please contact this office.        I personally performed the services described in this documentation, which was scribed in my presence. The recorded information has been reviewed and considered, and addended by me as needed.   Signed,   Matthew Ray, MD [facility].  07/13/16 5:36 PM

## 2016-07-13 NOTE — Patient Instructions (Addendum)
Injection today treats gonorrhea, oral antibiotics will treat chlamydia. I will check for both of these infections on tests today as well as HIV and syphilis tests. If your symptoms persist, especially if the testing is negative, can look into other causes.  As we discussed, and the third HPV vaccine today is off label use based on your age, but you will now be complete with that series.  Return to the clinic or go to the nearest emergency room if any of your symptoms worsen or new symptoms occur.   Urethritis, Adult Urethritis is an inflammation of the tube through which urine exits your bladder (urethra).  CAUSES Urethritis is often caused by an infection in your urethra. The infection can be viral, like herpes. The infection can also be bacterial, like gonorrhea. RISK FACTORS Risk factors of urethritis include:  Having sex without using a condom.  Having multiple sexual partners.  Having poor hygiene. SIGNS AND SYMPTOMS Symptoms of urethritis are less noticeable in women than in men. These symptoms include:  Burning feeling when you urinate (dysuria).  Discharge from your urethra.  Blood in your urine (hematuria).  Urinating more than usual. DIAGNOSIS  To confirm a diagnosis of urethritis, your health care provider will do the following:  Ask about your sexual history.  Perform a physical exam.  Have you provide a sample of your urine for lab testing.  Use a cotton swab to gently collect a sample from your urethra for lab testing. TREATMENT  It is important to treat urethritis. Depending on the cause, untreated urethritis may lead to serious genital infections and possibly infertility. Urethritis caused by a bacterial infection is treated with antibiotic medicine. All sexual partners must be treated.  HOME CARE INSTRUCTIONS  Do not have sex until the test results are known and treatment is completed, even if your symptoms go away before you finish treatment.  If you  were prescribed an antibiotic, finish it all even if you start to feel better. SEEK MEDICAL CARE IF:   Your symptoms are not improved in 3 days.  Your symptoms are getting worse.  You develop abdominal pain or pelvic pain (in women).  You develop joint pain.  You have a fever. SEEK IMMEDIATE MEDICAL CARE IF:   You have severe pain in the belly, back, or side.  You have repeated vomiting. MAKE SURE YOU:  Understand these instructions.  Will watch your condition.  Will get help right away if you are not doing well or get worse.   This information is not intended to replace advice given to you by your health care provider. Make sure you discuss any questions you have with your health care provider.   Document Released: 03/09/2001 Document Revised: 01/28/2015 Document Reviewed: 05/14/2013 Elsevier Interactive Patient Education 2016 [employer].   IF you received an x-ray today, you will receive an invoice from [facility]. Please contact [facility] at [phone] with questions or concerns regarding your invoice.   IF you received labwork today, you will receive an invoice from [employer]. Please contact [facility] at [phone] with questions or concerns regarding your invoice.   Our billing staff will not be able to assist you with questions regarding bills from these companies.  You will be contacted with the lab results as soon as they are available. The fastest way to get your results is to activate your My Chart account. Instructions are located on the last page of this paperwork. If you have not heard  from [place] regarding the results in 2 weeks, please contact this office.

## 2016-07-14 LAB — HIV ANTIBODY (ROUTINE TESTING W REFLEX): HIV 1&2 Ab, 4th Generation: NONREACTIVE

## 2016-07-15 LAB — GC/CHLAMYDIA PROBE AMP
CT Probe RNA: DETECTED — AB
GC PROBE AMP APTIMA: NOT DETECTED

## 2016-07-15 LAB — RPR

## 2016-07-21 ENCOUNTER — Telehealth: Payer: Self-pay

## 2016-07-21 ENCOUNTER — Encounter: Payer: Self-pay | Admitting: Family Medicine

## 2016-07-21 NOTE — Telephone Encounter (Signed)
Pt received his lab results on mychart but does have some questions that he would like a call back on   Best number [phone]

## 2016-07-21 NOTE — Telephone Encounter (Signed)
Called pt. He had questions. Told him to use my chart and send his message to [place].

## 2016-07-26 ENCOUNTER — Ambulatory Visit (INDEPENDENT_AMBULATORY_CARE_PROVIDER_SITE_OTHER): Payer: Federal, State, Local not specified - PPO | Admitting: Family Medicine

## 2016-07-26 VITALS — BP 114/78 | HR 72 | Temp 98.7°F | Resp 14 | Ht 71.5 in | Wt 200.0 lb

## 2016-07-26 DIAGNOSIS — N342 Other urethritis: Secondary | ICD-10-CM | POA: Diagnosis not present

## 2016-07-26 MED ORDER — AZITHROMYCIN 250 MG PO TABS: ORAL_TABLET | ORAL | 0 refills | Status: DC

## 2016-07-26 NOTE — Telephone Encounter (Signed)
Dr. Greene, FYI.

## 2016-07-26 NOTE — Assessment & Plan Note (Signed)
CT was positive. He was treated with azithro but still has some mild symptoms.  - sent azithromycin 1 g today  - advised to follow up if still having symptoms after 5 or 7 days.

## 2016-07-26 NOTE — Patient Instructions (Addendum)
Thank you for coming in,   I will send in another dose of azithromycin.   Please follow up in 7 days if your symptoms persist.    Please feel free to call with any questions or concerns at any time, at [phone]. --Dr. Raeford Razor     IF you received an x-ray today, you will receive an invoice from [facility]. Please contact [facility] at [phone] with questions or concerns regarding your invoice.   IF you received labwork today, you will receive an invoice from [employer]. Please contact [facility] at [phone] with questions or concerns regarding your invoice.   Our billing staff will not be able to assist you with questions regarding bills from these companies.  You will be contacted with the lab results as soon as they are available. The fastest way to get your results is to activate your My Chart account. Instructions are located on the last page of this paperwork. If you have not heard from [place] regarding the results in 2 weeks, please contact this office.

## 2016-07-26 NOTE — Progress Notes (Signed)
   Subjective:    Patient ID: Matthew Santiago, male    DOB: July 18, 1986, 30 y.o.   MRN: [id number]  Chief Complaint  Patient presents with  . Follow-up    Infective Urethritis    PCP: No PCP Per Patient  HPI  This is a 30 y.o. male who is presenting for follow up for infective urethritis. He was treated with antibiotics. RPR and HIV have been negative. He chlamydia probe was positive.   He is having some penile discharge. No fevers, chills or night sweats. No problems with urinating. He denies any rashes. Denies any inguinal LAD or scrotal pain. He completed the Azithromycin and was sexually active two days later. He reports this was with the same person that he was active with prior to be tested. He reports to be protective during this episode. His symptoms have not completely dissipated.   Review of Systems  PMH: ADHD, chondrosarcoma PShx: surgery on his chest to remove tumor  PSx: no tobacco. Occasional alcohol     Objective:   Physical Exam BP 114/78   Pulse 72   Temp 98.7 F (37.1 C) (Oral)   Resp 14   Ht 5' 11.5" (1.816 m)   Wt 200 lb (90.7 kg)   SpO2 99%   BMI 27.51 kg/m  Gen: NAD, alert, cooperative with exam, well-appearing HEENT: NCAT, EOMI, clear conjunctiva, oropharynx clear, supple neck CV: no edema, capillary refill brisk  Resp:  non-labored Abd: SNTND, BS present, no guarding or organomegaly Skin: no rashes, normal turgor  Neuro: no gross deficits.  Psych: alert and oriented GU: no inguinal LAD,      Assessment & Plan:   Urethritis CT was positive. He was treated with azithro but still has some mild symptoms.  - sent azithromycin 1 g today  - advised to follow up if still having symptoms after 5 or 7 days.

## 2016-08-02 ENCOUNTER — Ambulatory Visit (INDEPENDENT_AMBULATORY_CARE_PROVIDER_SITE_OTHER): Payer: Federal, State, Local not specified - PPO | Admitting: Family Medicine

## 2016-08-02 VITALS — BP 100/72 | HR 63 | Temp 98.2°F | Resp 16 | Ht 72.0 in | Wt 195.0 lb

## 2016-08-02 DIAGNOSIS — Z8619 Personal history of other infectious and parasitic diseases: Secondary | ICD-10-CM | POA: Diagnosis not present

## 2016-08-02 DIAGNOSIS — N342 Other urethritis: Secondary | ICD-10-CM | POA: Diagnosis not present

## 2016-08-02 DIAGNOSIS — R369 Urethral discharge, unspecified: Secondary | ICD-10-CM | POA: Diagnosis not present

## 2016-08-02 DIAGNOSIS — R350 Frequency of micturition: Secondary | ICD-10-CM

## 2016-08-02 LAB — POC MICROSCOPIC URINALYSIS (UMFC): Mucus: ABSENT

## 2016-08-02 LAB — POCT URINALYSIS DIP (MANUAL ENTRY)
Bilirubin, UA: NEGATIVE
GLUCOSE UA: NEGATIVE
Ketones, POC UA: NEGATIVE
Leukocytes, UA: NEGATIVE
NITRITE UA: NEGATIVE
PH UA: 5.5
PROTEIN UA: NEGATIVE
RBC UA: NEGATIVE
UROBILINOGEN UA: 0.2

## 2016-08-02 LAB — PSA: PSA: 0.9 ng/mL (ref <=4.0)

## 2016-08-02 MED ORDER — METRONIDAZOLE 500 MG PO TABS: 2000.0000 mg | ORAL_TABLET | Freq: Once | ORAL | 0 refills | Status: AC

## 2016-08-02 NOTE — Patient Instructions (Addendum)
I will check the chlamydia and gonorrhea testing one more time, as well as a prostate test due to the frequent urination. Antibiotic Flagyl was sent in to treat for non-gonococcal urethritis which can sometimes be caused by trichomonas. I did check a Trichomonas test as well today.   If your symptoms are not improving within the next 1 week to 10 days, especially if testing is negative, may need to meet with a urologist to discuss other testing or treatment. Let me know if you have questions in the meantime.   Urethritis, Adult Urethritis is an inflammation of the tube through which urine exits your bladder (urethra).  CAUSES Urethritis is often caused by an infection in your urethra. The infection can be viral, like herpes. The infection can also be bacterial, like gonorrhea. RISK FACTORS Risk factors of urethritis include:  Having sex without using a condom.  Having multiple sexual partners.  Having poor hygiene. SIGNS AND SYMPTOMS Symptoms of urethritis are less noticeable in women than in men. These symptoms include:  Burning feeling when you urinate (dysuria).  Discharge from your urethra.  Blood in your urine (hematuria).  Urinating more than usual. DIAGNOSIS  To confirm a diagnosis of urethritis, your health care provider will do the following:  Ask about your sexual history.  Perform a physical exam.  Have you provide a sample of your urine for lab testing.  Use a cotton swab to gently collect a sample from your urethra for lab testing. TREATMENT  It is important to treat urethritis. Depending on the cause, untreated urethritis may lead to serious genital infections and possibly infertility. Urethritis caused by a bacterial infection is treated with antibiotic medicine. All sexual partners must be treated.  HOME CARE INSTRUCTIONS  Do not have sex until the test results are known and treatment is completed, even if your symptoms go away before you finish  treatment.  If you were prescribed an antibiotic, finish it all even if you start to feel better. SEEK MEDICAL CARE IF:   Your symptoms are not improved in 3 days.  Your symptoms are getting worse.  You develop abdominal pain or pelvic pain (in women).  You develop joint pain.  You have a fever. SEEK IMMEDIATE MEDICAL CARE IF:   You have severe pain in the belly, back, or side.  You have repeated vomiting. MAKE SURE YOU:  Understand these instructions.  Will watch your condition.  Will get help right away if you are not doing well or get worse.   This information is not intended to replace advice given to you by your health care provider. Make sure you discuss any questions you have with your health care provider.   Document Released: 03/09/2001 Document Revised: 01/28/2015 Document Reviewed: 05/14/2013 Elsevier Interactive Patient Education 2016 [employer].   IF you received an x-ray today, you will receive an invoice from [facility]. Please contact [facility] at [phone] with questions or concerns regarding your invoice.   IF you received labwork today, you will receive an invoice from [employer]. Please contact [facility] at [phone] with questions or concerns regarding your invoice.   Our billing staff will not be able to assist you with questions regarding bills from these companies.  You will be contacted with the lab results as soon as they are available. The fastest way to get your results is to activate your My Chart account. Instructions are located on the last page of this paperwork. If you have not  heard from [place] regarding the results in 2 weeks, please contact this office.

## 2016-08-02 NOTE — Progress Notes (Signed)
Subjective:  By signing my name below, I, Moises Blood, attest that this documentation has been prepared under the direction and in the presence of [name], MD. Electronically Signed: Moises Blood, [place]. 08/02/2016 , 11:37 AM .  Patient was seen in Room 14 .   Patient ID: Matthew Santiago, male    DOB: October 10, 1985, 30 y.o.   MRN: [id number] Chief Complaint  Patient presents with  . Penile discharge    "reduced", but still   HPI Matthew Santiago is a 30 y.o. male  Here for follow up of penile discharge. He was treated for infected urethritis on 10/17 with rocephin 250mg  injection and azithromycin 1g. His testing was positive for chlamydia; HIV and RPR were non reactive. He was seen by Dr. Raeford Razor on 10/30 with persistent penile discharge. Per his note, he was sexually with same partner 2 days after completion of antibiotics, repeat azithromycin 1g was given. Here for follow up.   Patient states his partner wasn't treated. He wore a condom during intercourse. His discharge symptom have improved but returned (cloudy discharge). He would notice it throughout the day, most noticeable in the morning. He denies fever, chills, back pain, or hematuria. He does report having more frequent urination but less output.   Patient Active Problem List   Diagnosis Date Noted  . Urethritis 07/26/2016  . Immune to hepatitis B 09/12/2012   Past Medical History:  Diagnosis Date  . Allergy   . Anemia   . Cancer [facility])    Past Surgical History:  Procedure Laterality Date  . 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 Authorizing Provider  methylphenidate (CONCERTA) 18 MG CR tablet Take 1 tablet (18 mg total) by mouth daily. 08/30/13  Yes Ryan M Dunn, PA-C  naproxen (NAPROSYN) 500 MG tablet Take 500 mg by mouth 2 (two) times daily with a meal.   Yes Historical Provider, MD   Social History    Social History  . Marital status: Single    Spouse name: N/A  . Number of children: N/A  . Years of education: N/A   Occupational History  . Not on file.   Social History Main Topics  . Smoking status: Never Smoker  . Smokeless tobacco: Never Used  . Alcohol use 1.2 oz/week    2 Cans of beer per week  . Drug use: No  . Sexual activity: Yes    Birth control/ protection: None   Other Topics Concern  . Not on file   Social History Narrative  . No narrative on file   Review of Systems  Constitutional: Negative for chills, fatigue, fever and unexpected weight change.  Eyes: Negative for visual disturbance.  Respiratory: Negative for cough, chest tightness and shortness of breath.   Cardiovascular: Negative for chest pain, palpitations and leg swelling.  Gastrointestinal: Negative for abdominal pain and blood in stool.  Genitourinary: Positive for decreased urine volume, discharge and frequency. Negative for hematuria, penile pain, testicular pain and urgency.  Musculoskeletal: Negative for back pain.  Neurological: Negative for dizziness, light-headedness and headaches.       Objective:   Physical Exam  Constitutional: He is oriented to person, place, and time. He appears well-developed and well-nourished. No distress.  HENT:  Head: Normocephalic and atraumatic.  Eyes: EOM are normal. Pupils are equal, round, and reactive to light.  Neck: Neck supple.  Cardiovascular: Normal rate.  Pulmonary/Chest: Effort normal. No respiratory distress.  Abdominal: Hernia confirmed negative in the right inguinal area and confirmed negative in the left inguinal area.  Genitourinary: Testes normal. Right testis shows no tenderness. Left testis shows no tenderness.  Genitourinary Comments: Minimal clear discharge from urethra  Musculoskeletal: Normal range of motion.  Lymphadenopathy:       Right: No inguinal adenopathy present.       Left: No inguinal adenopathy present.   Neurological: He is alert and oriented to person, place, and time.  Skin: Skin is warm and dry.  Psychiatric: He has a normal mood and affect. His behavior is normal.  Nursing note and vitals reviewed.   Vitals:   08/02/16 1039  BP: 100/72  Pulse: 63  Resp: 16  Temp: 98.2 F (36.8 C)  TempSrc: Oral  SpO2: 99%  Weight: 195 lb (88.5 kg)  Height: 6' (1.829 m)   Results for orders placed or performed in visit on 08/02/16  POCT urinalysis dipstick  Result Value Ref Range   Color, UA yellow yellow   Clarity, UA clear clear   Glucose, UA negative negative   Bilirubin, UA negative negative   Ketones, POC UA negative negative   Spec Grav, UA <=1.005    Blood, UA negative negative   pH, UA 5.5    Protein Ur, POC negative negative   Urobilinogen, UA 0.2    Nitrite, UA Negative Negative   Leukocytes, UA Negative Negative  POCT Microscopic Urinalysis ([facility])  Result Value Ref Range   WBC,UR,HPF,POC None None WBC/hpf   RBC,UR,HPF,POC None None RBC/hpf   Bacteria None None, Too numerous to count   Mucus Absent Absent   Epithelial Cells, UR Per Microscopy None None, Too numerous to count cells/hpf       Assessment & Plan:    Matthew Santiago is a 30 y.o. male Infective urethritis - Plan: GC/Chlamydia Probe Amp, Trichomonas vaginalis RNA, Ql,Males, metroNIDAZOLE (FLAGYL) 500 MG tablet  Urinary frequency - Plan: POCT urinalysis dipstick, POCT Microscopic Urinalysis ([facility]), PSA  History of chlamydia - Plan: GC/Chlamydia Probe Amp, Trichomonas vaginalis RNA, Ql,Males  Penile discharge - Plan: GC/Chlamydia Probe Amp, Trichomonas vaginalis RNA, Ql,Males, metroNIDAZOLE (FLAGYL) 500 MG tablet  Possible persistent Chlamydia, however status post treatment 2. Will repeat Chlamydia/gonorrhea testing, and will start Flagyl 2000 mg for possible nongonococcal urethritis due to trichomonas. Trichomonas testing was also checked. Additionally will check PSA with reports of frequent  urination, RTC precautions, and safer sex practices discussed. If persistent symptoms without identifiable cause on above testing, consider urology evaluation.  Meds ordered this encounter  Medications  . metroNIDAZOLE (FLAGYL) 500 MG tablet    Sig: Take 4 tablets (2,000 mg total) by mouth once.    Dispense:  4 tablet    Refill:  0   Patient Instructions   I will check the chlamydia and gonorrhea testing one more time, as well as a prostate test due to the frequent urination. Antibiotic Flagyl was sent in to treat for non-gonococcal urethritis which can sometimes be caused by trichomonas. I did check a Trichomonas test as well today.   If your symptoms are not improving within the next 1 week to 10 days, especially if testing is negative, may need to meet with a urologist to discuss other testing or treatment. Let me know if you have questions in the meantime.   Urethritis, Adult Urethritis is an inflammation of the tube through which urine exits your bladder (urethra).  CAUSES Urethritis is often  caused by an infection in your urethra. The infection can be viral, like herpes. The infection can also be bacterial, like gonorrhea. RISK FACTORS Risk factors of urethritis include:  Having sex without using a condom.  Having multiple sexual partners.  Having poor hygiene. SIGNS AND SYMPTOMS Symptoms of urethritis are less noticeable in women than in men. These symptoms include:  Burning feeling when you urinate (dysuria).  Discharge from your urethra.  Blood in your urine (hematuria).  Urinating more than usual. DIAGNOSIS  To confirm a diagnosis of urethritis, your health care provider will do the following:  Ask about your sexual history.  Perform a physical exam.  Have you provide a sample of your urine for lab testing.  Use a cotton swab to gently collect a sample from your urethra for lab testing. TREATMENT  It is important to treat urethritis. Depending on the cause,  untreated urethritis may lead to serious genital infections and possibly infertility. Urethritis caused by a bacterial infection is treated with antibiotic medicine. All sexual partners must be treated.  HOME CARE INSTRUCTIONS  Do not have sex until the test results are known and treatment is completed, even if your symptoms go away before you finish treatment.  If you were prescribed an antibiotic, finish it all even if you start to feel better. SEEK MEDICAL CARE IF:   Your symptoms are not improved in 3 days.  Your symptoms are getting worse.  You develop abdominal pain or pelvic pain (in women).  You develop joint pain.  You have a fever. SEEK IMMEDIATE MEDICAL CARE IF:   You have severe pain in the belly, back, or side.  You have repeated vomiting. MAKE SURE YOU:  Understand these instructions.  Will watch your condition.  Will get help right away if you are not doing well or get worse.   This information is not intended to replace advice given to you by your health care provider. Make sure you discuss any questions you have with your health care provider.   Document Released: 03/09/2001 Document Revised: 01/28/2015 Document Reviewed: 05/14/2013 Elsevier Interactive Patient Education 2016 [employer].   IF you received an x-ray today, you will receive an invoice from [facility]. Please contact [facility] Radiology at [phone] with questions or concerns regarding your invoice.   IF you received labwork today, you will receive an invoice from [employer]. Please contact [facility] at [phone] with questions or concerns regarding your invoice.   Our billing staff will not be able to assist you with questions regarding bills from these companies.  You will be contacted with the lab results as soon as they are available. The fastest way to get your results is to activate your My Chart account. Instructions are located on the last  page of this paperwork. If you have not heard from [place] regarding the results in 2 weeks, please contact this office.       I personally performed the services described in this documentation, which was scribed in my presence. The recorded information has been reviewed and considered, and addended by me as needed.   Signed,   [name], MD [facility].  08/04/16 3:57 PM

## 2016-08-03 LAB — TRICHOMONAS VAGINALIS RNA, QL,MALES: TRICHOMONAS VAGINALIS RNA: NOT DETECTED

## 2016-08-03 LAB — GC/CHLAMYDIA PROBE AMP
CT Probe RNA: NOT DETECTED
GC PROBE AMP APTIMA: NOT DETECTED

## 2016-08-12 ENCOUNTER — Telehealth: Payer: Self-pay | Admitting: Family Medicine

## 2016-08-12 DIAGNOSIS — N342 Other urethritis: Secondary | ICD-10-CM

## 2016-08-12 NOTE — Telephone Encounter (Signed)
Pt calling to let you know that he's still having some discomfort and want to go on with the referral please respond

## 2016-08-13 NOTE — Telephone Encounter (Signed)
Will refer to urology. rtc if worse prior to that appointment

## 2016-08-21 ENCOUNTER — Encounter: Payer: Self-pay | Admitting: Family Medicine

## 2016-08-24 ENCOUNTER — Telehealth: Payer: Self-pay | Admitting: Emergency Medicine

## 2016-08-24 NOTE — Telephone Encounter (Signed)
Left message to returning My Chart message regarding Urology referral for continued sx's

## 2016-08-25 ENCOUNTER — Ambulatory Visit (INDEPENDENT_AMBULATORY_CARE_PROVIDER_SITE_OTHER): Payer: Federal, State, Local not specified - PPO | Admitting: Urology

## 2016-08-25 ENCOUNTER — Encounter: Payer: Self-pay | Admitting: Urology

## 2016-08-25 VITALS — BP 114/77 | HR 64 | Ht 72.0 in | Wt 198.7 lb

## 2016-08-25 DIAGNOSIS — Z202 Contact with and (suspected) exposure to infections with a predominantly sexual mode of transmission: Secondary | ICD-10-CM | POA: Diagnosis not present

## 2016-08-25 DIAGNOSIS — N342 Other urethritis: Secondary | ICD-10-CM | POA: Diagnosis not present

## 2016-08-25 LAB — URINALYSIS, COMPLETE
BILIRUBIN UA: NEGATIVE
Glucose, UA: NEGATIVE
KETONES UA: NEGATIVE
LEUKOCYTES UA: NEGATIVE
NITRITE UA: NEGATIVE
PH UA: 7 (ref 5.0–7.5)
Protein, UA: NEGATIVE
RBC UA: NEGATIVE
Specific Gravity, UA: 1.015 (ref 1.005–1.030)
Urobilinogen, Ur: 0.2 mg/dL (ref 0.2–1.0)

## 2016-08-25 LAB — MICROSCOPIC EXAMINATION: Bacteria, UA: NONE SEEN

## 2016-08-25 MED ORDER — DOXYCYCLINE HYCLATE 100 MG PO CAPS: 100.0000 mg | ORAL_CAPSULE | Freq: Two times a day (BID) | ORAL | 0 refills | Status: AC

## 2016-08-25 MED ORDER — CEFTRIAXONE SODIUM 500 MG IJ SOLR
1000.0000 mg | Freq: Once | INTRAMUSCULAR | Status: AC
  Administered 2016-08-25: 1000 mg via INTRAMUSCULAR

## 2016-08-25 NOTE — Progress Notes (Signed)
08/25/2016 10:17 AM   Matthew Santiago 1985/12/13 [id number]  Referring provider: Wendie Agreste, MD [address] [place], [place] [address]  Chief Complaint  Patient presents with  . New Patient (Initial Visit)    STD Check    HPI: The patient is a 30 year old gentleman presents for evaluation for urethral redness. He was diagnosed with chlamydia infection on 07/13/2016. She is treated with Rocephin and azithromycin at that time. He also the negative RPR and HIV at that time.  His symptoms resolved after treatment however they returned. His partner was tested and treated at the same time for Chlamydia. He still has drainage from his penis and initial dysuria when urinating. This is the second time he has an STD. He was treated in approximately 2010 for this. He is active with only one sexual partner. He feels that his partner is not sexually active with other partners. When his partner was treated for Chlamydia, she was asymptomatic.   PMH: Past Medical History:  Diagnosis Date  . Allergy   . Anemia   . Cancer [facility])     Surgical History: Past Surgical History:  Procedure Laterality Date  . 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 Always use your most recent med list.          doxycycline 100 MG capsule Commonly known as:  VIBRAMYCIN Take 1 capsule (100 mg total) by mouth 2 (two) times daily.   methylphenidate 18 MG CR tablet Commonly known as:  CONCERTA Take 1 tablet (18 mg total) by mouth daily.   naproxen 500 MG tablet Commonly known as:  NAPROSYN Take 500 mg by mouth 2 (two) times daily with a meal.       Allergies: No Known Allergies  Family History: Family History  Problem Relation Age of Onset  . Cancer Maternal Grandmother   . Diabetes Maternal Grandmother   . Heart disease Maternal Grandfather   . Cancer Paternal Grandmother   . Hyperlipidemia Brother   .  Prostate cancer Neg Hx   . Bladder Cancer Neg Hx   . Kidney cancer Neg Hx     Social History:  reports that he has never smoked. He has never used smokeless tobacco. He reports that he drinks about 1.2 oz of alcohol per week . He reports that he does not use drugs.  ROS: UROLOGY Frequent Urination?: Yes Hard to postpone urination?: No Burning/pain with urination?: Yes Get up at night to urinate?: Yes Leakage of urine?: No Urine stream starts and stops?: No Trouble starting stream?: No Do you have to strain to urinate?: No Blood in urine?: No Urinary tract infection?: No Sexually transmitted disease?: Yes Injury to kidneys or bladder?: No Painful intercourse?: No Weak stream?: Yes Erection problems?: No Penile pain?: Yes  Gastrointestinal Nausea?: No Vomiting?: No Indigestion/heartburn?: No Diarrhea?: No Constipation?: No  Constitutional Fever: No Night sweats?: No Weight loss?: No Fatigue?: No  Skin Skin rash/lesions?: No Itching?: No  Eyes Blurred vision?: No Double vision?: No  Ears/Nose/Throat Sore throat?: No Sinus problems?: No  Hematologic/Lymphatic Swollen glands?: No Easy bruising?: No  Cardiovascular Leg swelling?: No Chest pain?: No  Respiratory Cough?: No Shortness of breath?: No  Endocrine Excessive thirst?: No  Musculoskeletal Back pain?: No Joint pain?: No  Neurological Headaches?: No Dizziness?: No  Psychologic Depression?: No Anxiety?: No  Physical Exam: BP 114/77 (BP  Location: Left Arm, Patient Position: Sitting, Cuff Size: Normal)   Pulse 64   Ht 6' (1.829 m)   Wt 198 lb 11.2 oz (90.1 kg)   BMI 26.95 kg/m   Constitutional:  Alert and oriented, No acute distress. HEENT: [place] AT, moist mucus membranes.  Trachea midline, no masses. Cardiovascular: No clubbing, cyanosis, or edema. Respiratory: Normal respiratory effort, no increased work of breathing. GI: Abdomen is soft, nontender, nondistended, no abdominal  masses GU: No CVA tenderness. Normal phallus. Drainage noted from penis that is clear. Testicles descended equally bilaterally no masses. Skin: No rashes, bruises or suspicious lesions. Lymph: No cervical or inguinal adenopathy. Neurologic: Grossly intact, no focal deficits, moving all 4 extremities. Psychiatric: Normal mood and affect.  Laboratory Data: Lab Results  Component Value Date   WBC 6.9 06/01/2013   HGB 15.6 06/01/2013   HCT 47.8 06/01/2013   MCV 97.9 (A) 06/01/2013    No results found for: CREATININE  Lab Results  Component Value Date   PSA 0.9 08/02/2016    No results found for: TESTOSTERONE  No results found for: HGBA1C  Urinalysis    Component Value Date/Time   BILIRUBINUR negative 08/02/2016 1201   BILIRUBINUR neg 09/12/2012 1520   KETONESUR negative 08/02/2016 1201   PROTEINUR negative 08/02/2016 1201   PROTEINUR neg 09/12/2012 1520   UROBILINOGEN 0.2 08/02/2016 1201   NITRITE Negative 08/02/2016 1201   NITRITE neg 09/12/2012 1520   LEUKOCYTESUR Negative 08/02/2016 1201    Assessment & Plan:    1. Urethritis The patient has likely either develop chlamydia resistant to azithromycin or he has reinfection with chlamydia. We will cover him with Rocephin today in the office in the event he has a new infection with gonorrhea. We will start him on doxycycline for a week to cover for chlamydia. His [facility] again was [facility] and chlamydia testing. He'll call the office if his symptoms return or persist. I have also advised him to not engage in intercourse until symptom resolution and at least 7 days of antibiotics. His partner will be retested and possibly retreated for chlamydia.   Return if symptoms worsen or fail to improve.  Nickie Retort, MD  [facility] [address] [place], [place] [address] [phone]

## 2016-08-26 NOTE — Telephone Encounter (Signed)
Referrals, I didn't know if you needed to be aware of this? Please see message from pt.

## 2016-08-27 LAB — GC/CHLAMYDIA PROBE AMP
CHLAMYDIA, DNA PROBE: POSITIVE — AB
Neisseria gonorrhoeae by PCR: NEGATIVE

## 2016-08-31 ENCOUNTER — Telehealth: Payer: Self-pay

## 2016-08-31 NOTE — Telephone Encounter (Signed)
Pt called requesting GC/Chlamydia results. Pt also stated that he continuing to have some discomfort and minor pains in the penis. Please advise.

## 2016-09-01 NOTE — Telephone Encounter (Signed)
Spoke with pt in reference to +chlamydia test. Made aware pain can continue for a while and take advil for discomfort. Pt voiced understanding.

## 2016-09-07 ENCOUNTER — Ambulatory Visit: Payer: Federal, State, Local not specified - PPO

## 2018-02-10 ENCOUNTER — Encounter: Payer: Self-pay | Admitting: Family Medicine

## 2018-02-15 ENCOUNTER — Encounter: Payer: Self-pay | Admitting: Family Medicine

## 2018-11-27 ENCOUNTER — Ambulatory Visit: Payer: Self-pay | Admitting: *Deleted

## 2018-11-27 NOTE — Telephone Encounter (Addendum)
Pt having left ear pressure since this morning at 0545; he has taken sudafed per pharmacists but has no relief in symptoms; he also complains of sneezing; the says that he hears constant hum; the pt denies pain but he rates the pressure at 4 out of 10; recommendations made per protocol; pt last seen 07/2016; he would like to be seen in the office today; there is no availability in the office today; explained to pt that he will need 2 appointments: acute and transfer of care; also informed pt that he can proceed to urgent care today, and a transfer of care appointment can be scheduled for another day; he would like to schedule appointment for acute issue;  pt originally offered and accepted appointment 11/28/2018 at 1020 with Dr Mitchel Honour, [facility]; reconfirmed that this is for his acute issue only; Dr Nyoka Cowden is not accepting new patients, and the providers that he had seen previously are no longer with the practice; attempted to schedule the pt with Dr Pamella Pert or Dr Nolon Rod at late date but he still would like to only be seen once; offered pt spoke with Solmon Ice at [facility] and she confirms the above information given to the pt; he cancels the previously scheduled appointment and states that he will explore other options; will route to office for notification of this encounter.       ;Reason for Disposition . Decreased hearing followed sudden, extremely loud noise (e.g., explosion, not just loud concert)  Answer Assessment - Initial Assessment Questions 1. LOCATION: "Which ear is involved?"     Left ear 2. ONSET: "When did the ear start hurting"      11/27/2018 at 0545 3. SEVERITY: "How bad is the pain?"  (Scale 1-10; mild, moderate or severe)   - MILD (1-3): doesn't interfere with normal activities    - MODERATE (4-7): interferes with normal activities or awakens from sleep    - SEVERE (8-10): excruciating pain, unable to do any normal activities      Pressure rated at 4 out of 1- 4. URI SYMPTOMS: "  Do you have a runny nose or cough?"     sneezing 5. FEVER: "Do you have a fever?" If so, ask: "What is your temperature, how was it measured, and when did it start?"     No temp 96.9 and 97.7 on 11/27/2018 6. CAUSE: "Have you been swimming recently?", "How often do you use Q-TIPS?", "Have you had any recent air travel or scuba diving?"     No swimming; no q-tips; no air travel or scuba 7. OTHER SYMPTOMS: "Do you have any other symptoms?" (e.g., headache, stiff neck, dizziness, vomiting, runny nose, decreased hearing)     Decreased hearing in left ear (tonal issue) especially at certain frequencies 8. PREGNANCY: "Is there any chance you are pregnant?" "When was your last menstrual period?"     n/a  Answer Assessment - Initial Assessment Questions 1. DESCRIPTION: "Describe the sound you are hearing." (e.g., hissing, humming, pounding, ringing)     humming 2. LOCATION: "One or both ears?" If one, ask: "Which ear?"     Left ear 3. SEVERITY: "How bad is it?"    - MILD - doesn't interfere with normal activities, only can hear in a quiet room    - MODERATE - SEVERE ([place]other[place]): interferes with work, school, sleep, or other activities      moderate 4. ONSET: "When did this begin?" "Did it start suddenly or come on gradually?"  11/27/2018 at 0545 5. PATTERN: "Does this come and go, or has it been constant since it started?"     constant 6. HEARING LOSS: "Is your hearing decreased?" (e.g., normal, decreased)       yes 7. OTHER SYMPTOMS: "Do you have any other symptoms?" (e.g., dizziness, earache)     Sneezing; nasal/sinus congestion for years  8. PREGNANCY: "Is there any chance you are pregnant?" "When was your last menstrual period?"     n/a  Protocols used: TINNITUS-A-AH, EARACHE-A-AH

## 2018-11-28 ENCOUNTER — Ambulatory Visit: Payer: Self-pay | Admitting: Emergency Medicine

## 2018-11-28 NOTE — Telephone Encounter (Signed)
Noted  

## 2019-06-07 DIAGNOSIS — R369 Urethral discharge, unspecified: Secondary | ICD-10-CM | POA: Diagnosis not present

## 2019-08-13 DIAGNOSIS — Z20828 Contact with and (suspected) exposure to other viral communicable diseases: Secondary | ICD-10-CM | POA: Diagnosis not present

## 2020-01-17 ENCOUNTER — Emergency Department (HOSPITAL_COMMUNITY): Payer: Federal, State, Local not specified - PPO

## 2020-01-17 ENCOUNTER — Encounter (HOSPITAL_COMMUNITY): Payer: Self-pay | Admitting: *Deleted

## 2020-01-17 ENCOUNTER — Other Ambulatory Visit: Payer: Self-pay

## 2020-01-17 ENCOUNTER — Emergency Department (HOSPITAL_COMMUNITY)
Admission: EM | Admit: 2020-01-17 | Discharge: 2020-01-18 | Disposition: A | Discharge status: Home / Self Care | Payer: Federal, State, Local not specified - PPO | Attending: Emergency Medicine | Admitting: Emergency Medicine

## 2020-01-17 DIAGNOSIS — S2232XA Fracture of one rib, left side, initial encounter for closed fracture: Secondary | ICD-10-CM | POA: Diagnosis not present

## 2020-01-17 DIAGNOSIS — Z79899 Other long term (current) drug therapy: Secondary | ICD-10-CM | POA: Insufficient documentation

## 2020-01-17 DIAGNOSIS — Y999 Unspecified external cause status: Secondary | ICD-10-CM | POA: Insufficient documentation

## 2020-01-17 DIAGNOSIS — S4992XA Unspecified injury of left shoulder and upper arm, initial encounter: Secondary | ICD-10-CM | POA: Diagnosis not present

## 2020-01-17 DIAGNOSIS — R52 Pain, unspecified: Secondary | ICD-10-CM | POA: Diagnosis not present

## 2020-01-17 DIAGNOSIS — Z23 Encounter for immunization: Secondary | ICD-10-CM | POA: Insufficient documentation

## 2020-01-17 DIAGNOSIS — Z85828 Personal history of other malignant neoplasm of skin: Secondary | ICD-10-CM | POA: Insufficient documentation

## 2020-01-17 DIAGNOSIS — Y929 Unspecified place or not applicable: Secondary | ICD-10-CM | POA: Diagnosis not present

## 2020-01-17 DIAGNOSIS — Y9355 Activity, bike riding: Secondary | ICD-10-CM | POA: Diagnosis not present

## 2020-01-17 DIAGNOSIS — M25512 Pain in left shoulder: Secondary | ICD-10-CM

## 2020-01-17 DIAGNOSIS — R58 Hemorrhage, not elsewhere classified: Secondary | ICD-10-CM | POA: Diagnosis not present

## 2020-01-17 DIAGNOSIS — S43102A Unspecified dislocation of left acromioclavicular joint, initial encounter: Secondary | ICD-10-CM | POA: Diagnosis not present

## 2020-01-17 DIAGNOSIS — S0081XA Abrasion of other part of head, initial encounter: Secondary | ICD-10-CM | POA: Diagnosis not present

## 2020-01-17 DIAGNOSIS — R55 Syncope and collapse: Secondary | ICD-10-CM | POA: Diagnosis not present

## 2020-01-17 DIAGNOSIS — S60511A Abrasion of right hand, initial encounter: Secondary | ICD-10-CM | POA: Insufficient documentation

## 2020-01-17 DIAGNOSIS — R0602 Shortness of breath: Secondary | ICD-10-CM | POA: Diagnosis not present

## 2020-01-17 MED ORDER — TETANUS-DIPHTH-ACELL PERTUSSIS 5-2.5-18.5 LF-MCG/0.5 IM SUSP
0.5000 mL | Freq: Once | INTRAMUSCULAR | Status: AC
  Administered 2020-01-17: 0.5 mL via INTRAMUSCULAR
  Filled 2020-01-17: qty 0.5

## 2020-01-17 MED ORDER — HYDROCODONE-ACETAMINOPHEN 5-325 MG PO TABS: 1.0000 | ORAL_TABLET | Freq: Four times a day (QID) | ORAL | 0 refills | Status: AC | PRN

## 2020-01-17 MED ORDER — IBUPROFEN 400 MG PO TABS
600.0000 mg | ORAL_TABLET | Freq: Once | ORAL | Status: AC
  Administered 2020-01-17: 600 mg via ORAL
  Filled 2020-01-17: qty 1

## 2020-01-17 MED ORDER — OXYCODONE-ACETAMINOPHEN 5-325 MG PO TABS
1.0000 | ORAL_TABLET | Freq: Once | ORAL | Status: AC
  Administered 2020-01-17: 1 via ORAL
  Filled 2020-01-17: qty 1

## 2020-01-17 NOTE — ED Notes (Signed)
Pt states that he is feeling nauseous and light headed. Pt given emesis bag.
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
## 2020-01-18 DIAGNOSIS — S43122A Dislocation of left acromioclavicular joint, 100%-200% displacement, initial encounter: Secondary | ICD-10-CM | POA: Diagnosis not present

## 2020-01-18 DIAGNOSIS — M25512 Pain in left shoulder: Secondary | ICD-10-CM | POA: Diagnosis not present

## 2020-01-18 NOTE — ED Notes (Signed)
ED Provider at bedside. 

## 2020-01-29 DIAGNOSIS — S43122D Dislocation of left acromioclavicular joint, 100%-200% displacement, subsequent encounter: Secondary | ICD-10-CM | POA: Diagnosis not present

## 2020-02-27 DIAGNOSIS — S43122D Dislocation of left acromioclavicular joint, 100%-200% displacement, subsequent encounter: Secondary | ICD-10-CM | POA: Diagnosis not present

## 2020-03-28 DIAGNOSIS — S43122D Dislocation of left acromioclavicular joint, 100%-200% displacement, subsequent encounter: Secondary | ICD-10-CM | POA: Diagnosis not present

## 2020-04-11 DIAGNOSIS — M25512 Pain in left shoulder: Secondary | ICD-10-CM | POA: Diagnosis not present

## 2020-04-21 DIAGNOSIS — M25512 Pain in left shoulder: Secondary | ICD-10-CM | POA: Diagnosis not present

## 2020-04-28 DIAGNOSIS — M25512 Pain in left shoulder: Secondary | ICD-10-CM | POA: Diagnosis not present

## 2020-05-15 DIAGNOSIS — M25512 Pain in left shoulder: Secondary | ICD-10-CM | POA: Diagnosis not present

## 2020-05-19 DIAGNOSIS — M25512 Pain in left shoulder: Secondary | ICD-10-CM | POA: Diagnosis not present

## 2020-05-19 DIAGNOSIS — S43122D Dislocation of left acromioclavicular joint, 100%-200% displacement, subsequent encounter: Secondary | ICD-10-CM | POA: Diagnosis not present

## 2020-08-12 DIAGNOSIS — R0789 Other chest pain: Secondary | ICD-10-CM | POA: Diagnosis not present

## 2020-08-12 DIAGNOSIS — Z20822 Contact with and (suspected) exposure to covid-19: Secondary | ICD-10-CM | POA: Diagnosis not present

## 2020-10-02 DIAGNOSIS — Z85830 Personal history of malignant neoplasm of bone: Secondary | ICD-10-CM | POA: Diagnosis not present

## 2020-10-02 DIAGNOSIS — G4700 Insomnia, unspecified: Secondary | ICD-10-CM | POA: Diagnosis not present

## 2020-10-02 DIAGNOSIS — M71332 Other bursal cyst, left wrist: Secondary | ICD-10-CM | POA: Diagnosis not present

## 2020-10-21 DIAGNOSIS — M67432 Ganglion, left wrist: Secondary | ICD-10-CM | POA: Diagnosis not present

## 2020-10-21 DIAGNOSIS — M7989 Other specified soft tissue disorders: Secondary | ICD-10-CM | POA: Diagnosis not present

## 2020-11-03 DIAGNOSIS — Z85830 Personal history of malignant neoplasm of bone: Secondary | ICD-10-CM | POA: Diagnosis not present

## 2020-11-03 DIAGNOSIS — J984 Other disorders of lung: Secondary | ICD-10-CM | POA: Diagnosis not present

## 2020-11-03 DIAGNOSIS — M954 Acquired deformity of chest and rib: Secondary | ICD-10-CM | POA: Diagnosis not present

## 2020-11-03 DIAGNOSIS — C419 Malignant neoplasm of bone and articular cartilage, unspecified: Secondary | ICD-10-CM | POA: Diagnosis not present

## 2020-11-03 DIAGNOSIS — Z08 Encounter for follow-up examination after completed treatment for malignant neoplasm: Secondary | ICD-10-CM | POA: Diagnosis not present

## 2020-11-03 DIAGNOSIS — Z969 Presence of functional implant, unspecified: Secondary | ICD-10-CM | POA: Diagnosis not present

## 2020-12-31 DIAGNOSIS — Z0000 Encounter for general adult medical examination without abnormal findings: Secondary | ICD-10-CM | POA: Diagnosis not present

## 2020-12-31 DIAGNOSIS — Z13220 Encounter for screening for lipoid disorders: Secondary | ICD-10-CM | POA: Diagnosis not present

## 2020-12-31 DIAGNOSIS — Z1329 Encounter for screening for other suspected endocrine disorder: Secondary | ICD-10-CM | POA: Diagnosis not present

## 2021-01-26 DIAGNOSIS — Z113 Encounter for screening for infections with a predominantly sexual mode of transmission: Secondary | ICD-10-CM | POA: Diagnosis not present
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
## 2021-09-03 DIAGNOSIS — Z8781 Personal history of (healed) traumatic fracture: Secondary | ICD-10-CM | POA: Diagnosis not present

## 2021-09-03 DIAGNOSIS — Z9889 Other specified postprocedural states: Secondary | ICD-10-CM | POA: Diagnosis not present

## 2021-09-03 DIAGNOSIS — R531 Weakness: Secondary | ICD-10-CM | POA: Diagnosis not present

## 2021-09-16 DIAGNOSIS — R531 Weakness: Secondary | ICD-10-CM | POA: Diagnosis not present

## 2021-09-16 DIAGNOSIS — Z9889 Other specified postprocedural states: Secondary | ICD-10-CM | POA: Diagnosis not present

## 2021-09-16 DIAGNOSIS — Z8781 Personal history of (healed) traumatic fracture: Secondary | ICD-10-CM | POA: Diagnosis not present

## 2022-02-01 DIAGNOSIS — Z131 Encounter for screening for diabetes mellitus: Secondary | ICD-10-CM | POA: Diagnosis not present

## 2022-02-01 DIAGNOSIS — Z13220 Encounter for screening for lipoid disorders: Secondary | ICD-10-CM | POA: Diagnosis not present

## 2022-02-01 DIAGNOSIS — Z0000 Encounter for general adult medical examination without abnormal findings: Secondary | ICD-10-CM | POA: Diagnosis not present

## 2022-06-16 DIAGNOSIS — M7989 Other specified soft tissue disorders: Secondary | ICD-10-CM | POA: Diagnosis not present

## 2022-12-30 DIAGNOSIS — Z113 Encounter for screening for infections with a predominantly sexual mode of transmission: Secondary | ICD-10-CM | POA: Diagnosis not present

## 2022-12-30 DIAGNOSIS — Z114 Encounter for screening for human immunodeficiency virus [HIV]: Secondary | ICD-10-CM | POA: Diagnosis not present

## 2022-12-30 DIAGNOSIS — N341 Nonspecific urethritis: Secondary | ICD-10-CM | POA: Diagnosis not present

## 2023-02-03 DIAGNOSIS — Z85830 Personal history of malignant neoplasm of bone: Secondary | ICD-10-CM | POA: Diagnosis not present

## 2023-02-03 DIAGNOSIS — Z13220 Encounter for screening for lipoid disorders: Secondary | ICD-10-CM | POA: Diagnosis not present

## 2023-02-03 DIAGNOSIS — Z0000 Encounter for general adult medical examination without abnormal findings: Secondary | ICD-10-CM | POA: Diagnosis not present

## 2023-02-03 DIAGNOSIS — Z131 Encounter for screening for diabetes mellitus: Secondary | ICD-10-CM | POA: Diagnosis not present

## 2023-03-02 DIAGNOSIS — F902 Attention-deficit hyperactivity disorder, combined type: Secondary | ICD-10-CM | POA: Diagnosis not present

## 2023-03-05 DIAGNOSIS — R059 Cough, unspecified: Secondary | ICD-10-CM | POA: Diagnosis not present

## 2023-03-05 DIAGNOSIS — J329 Chronic sinusitis, unspecified: Secondary | ICD-10-CM | POA: Diagnosis not present

## 2023-03-05 DIAGNOSIS — R0981 Nasal congestion: Secondary | ICD-10-CM | POA: Diagnosis not present

## 2023-03-05 DIAGNOSIS — J40 Bronchitis, not specified as acute or chronic: Secondary | ICD-10-CM | POA: Diagnosis not present

## 2023-04-19 DIAGNOSIS — F900 Attention-deficit hyperactivity disorder, predominantly inattentive type: Secondary | ICD-10-CM | POA: Diagnosis not present

## 2023-05-19 DIAGNOSIS — F900 Attention-deficit hyperactivity disorder, predominantly inattentive type: Secondary | ICD-10-CM | POA: Diagnosis not present

## 2023-06-23 DIAGNOSIS — J069 Acute upper respiratory infection, unspecified: Secondary | ICD-10-CM | POA: Diagnosis not present

## 2023-06-23 DIAGNOSIS — F900 Attention-deficit hyperactivity disorder, predominantly inattentive type: Secondary | ICD-10-CM | POA: Diagnosis not present

## 2023-06-27 DIAGNOSIS — D225 Melanocytic nevi of trunk: Secondary | ICD-10-CM | POA: Diagnosis not present

## 2023-06-27 DIAGNOSIS — L821 Other seborrheic keratosis: Secondary | ICD-10-CM | POA: Diagnosis not present

## 2023-06-27 DIAGNOSIS — L814 Other melanin hyperpigmentation: Secondary | ICD-10-CM | POA: Diagnosis not present

## 2023-06-27 DIAGNOSIS — L218 Other seborrheic dermatitis: Secondary | ICD-10-CM | POA: Diagnosis not present

## 2023-08-30 DIAGNOSIS — Z114 Encounter for screening for human immunodeficiency virus [HIV]: Secondary | ICD-10-CM | POA: Diagnosis not present

## 2023-08-30 DIAGNOSIS — Z113 Encounter for screening for infections with a predominantly sexual mode of transmission: Secondary | ICD-10-CM | POA: Diagnosis not present

## 2023-09-02 DIAGNOSIS — F900 Attention-deficit hyperactivity disorder, predominantly inattentive type: Secondary | ICD-10-CM | POA: Diagnosis not present

## 2023-09-15 DIAGNOSIS — H9202 Otalgia, left ear: Secondary | ICD-10-CM | POA: Diagnosis not present

## 2023-10-06 DIAGNOSIS — K080 Exfoliation of teeth due to systemic causes: Secondary | ICD-10-CM | POA: Diagnosis not present

## 2024-02-03 DIAGNOSIS — Z114 Encounter for screening for human immunodeficiency virus [HIV]: Secondary | ICD-10-CM | POA: Diagnosis not present

## 2024-02-03 DIAGNOSIS — Z113 Encounter for screening for infections with a predominantly sexual mode of transmission: Secondary | ICD-10-CM | POA: Diagnosis not present

## 2024-02-17 DIAGNOSIS — F909 Attention-deficit hyperactivity disorder, unspecified type: Secondary | ICD-10-CM | POA: Diagnosis not present

## 2024-02-17 DIAGNOSIS — Z0000 Encounter for general adult medical examination without abnormal findings: Secondary | ICD-10-CM | POA: Diagnosis not present

## 2024-02-17 DIAGNOSIS — Z1329 Encounter for screening for other suspected endocrine disorder: Secondary | ICD-10-CM | POA: Diagnosis not present

## 2024-07-11 DIAGNOSIS — J029 Acute pharyngitis, unspecified: Secondary | ICD-10-CM | POA: Diagnosis not present

## 2024-07-11 DIAGNOSIS — J3489 Other specified disorders of nose and nasal sinuses: Secondary | ICD-10-CM | POA: Diagnosis not present
